# Patient Record
Sex: Female | Born: 1985 | Race: Black or African American | Hispanic: No | Marital: Single | Smoking: Never smoker
Health system: Southern US, Community
[De-identification: ages and names within clinical notes are randomized; demographics above are authoritative.]

## PROBLEM LIST (undated history)

## (undated) DIAGNOSIS — K219 Gastro-esophageal reflux disease without esophagitis: Secondary | ICD-10-CM

## (undated) DIAGNOSIS — K0889 Other specified disorders of teeth and supporting structures: Secondary | ICD-10-CM

## (undated) DIAGNOSIS — D649 Anemia, unspecified: Secondary | ICD-10-CM

## (undated) HISTORY — DX: Gastro-esophageal reflux disease without esophagitis: K21.9

## (undated) HISTORY — DX: Other specified disorders of teeth and supporting structures: K08.89

---

## 2011-12-27 ENCOUNTER — Emergency Department (INDEPENDENT_AMBULATORY_CARE_PROVIDER_SITE_OTHER)
Admission: EM | Admit: 2011-12-27 | Discharge: 2011-12-27 | Disposition: A | Discharge status: Home / Self Care | Payer: BC Managed Care – PPO | Source: Home / Self Care | Attending: Emergency Medicine | Admitting: Emergency Medicine

## 2011-12-27 ENCOUNTER — Encounter (HOSPITAL_COMMUNITY): Payer: Self-pay | Admitting: *Deleted

## 2011-12-27 DIAGNOSIS — K047 Periapical abscess without sinus: Secondary | ICD-10-CM

## 2011-12-27 MED ORDER — HYDROCODONE-ACETAMINOPHEN 5-325 MG PO TABS
ORAL_TABLET | ORAL | Status: AC
  Filled 2011-12-27: qty 2

## 2011-12-27 MED ORDER — PENICILLIN V POTASSIUM 500 MG PO TABS: 500.0000 mg | ORAL_TABLET | Freq: Four times a day (QID) | ORAL | Status: AC

## 2011-12-27 MED ORDER — HYDROCODONE-ACETAMINOPHEN 5-325 MG PO TABS
2.0000 | ORAL_TABLET | Freq: Once | ORAL | Status: AC
  Administered 2011-12-27: 2 via ORAL

## 2011-12-27 MED ORDER — HYDROCODONE-ACETAMINOPHEN 5-325 MG PO TABS: 2.0000 | ORAL_TABLET | ORAL | Status: AC | PRN

## 2011-12-27 NOTE — ED Provider Notes (Signed)
Medical screening examination/treatment/procedure(s) were performed by non-physician practitioner and as supervising physician I was immediately available for consultation/collaboration.  Leslee Home, M.D.   Reuben Likes, MD 12/27/11 [phone]

## 2011-12-27 NOTE — ED Provider Notes (Signed)
History     CSN: [id number]  Arrival date & time 12/27/11  1810   First MD Initiated Contact with Patient 12/27/11 1909      Chief Complaint  Patient presents with  . Dental Pain    (Consider location/radiation/quality/duration/timing/severity/associated sxs/prior treatment) HPI Comments: Pt with "2 rotten teeth" that began hurting 2 days ago, facial swelling as of this morning.   Patient is a 26 y.o. female presenting with tooth pain. The history is provided by the patient.  Dental PainThe primary symptoms include mouth pain. Primary symptoms do not include fever. The symptoms began 2 days ago. The symptoms are worsening. The symptoms are new. The symptoms occur constantly.  Additional symptoms include: dental sensitivity to temperature, gum swelling and facial swelling.    History reviewed. No pertinent past medical history.  Past Surgical History  Procedure Date  . Cesarean section     History reviewed. No pertinent family history.  History  Substance Use Topics  . Smoking status: Not on file  . Smokeless tobacco: Not on file  . Alcohol Use: No    OB History    Grav Para Term Preterm Abortions TAB SAB Ect Mult Living                  Review of Systems  Constitutional: Positive for chills. Negative for fever.  HENT: Positive for facial swelling and dental problem.   Skin: Negative for color change and wound.    Allergies  Review of patient's allergies indicates no known allergies.  Home Medications   Current Outpatient Rx  Name Route Sig Dispense Refill  . HYDROCODONE-ACETAMINOPHEN 5-325 MG PO TABS Oral Take 2 tablets by mouth every 4 (four) hours as needed for pain. 16 tablet 0  . PENICILLIN V POTASSIUM 500 MG PO TABS Oral Take 1 tablet (500 mg total) by mouth 4 (four) times daily. 40 tablet 0    BP 143/112  Pulse 104  Temp 98.7 F (37.1 C) (Oral)  Resp 21  SpO2 100%  LMP 11/27/2011  Physical Exam  Constitutional: She appears well-developed and  well-nourished.       Uncomfortable appearing  HENT:  Head: There is trismus in the jaw.  Mouth/Throat: Abnormal dentition. Dental abscesses and dental caries present.         Teeth in poor repair  Lymphadenopathy:       Head (right side): Submandibular and tonsillar adenopathy present. No submental adenopathy present.       Head (left side): No submental, no submandibular and no tonsillar adenopathy present.  Skin: Skin is warm, dry and intact. No rash noted. No erythema.    ED Course  Procedures (including critical care time)  Labs Reviewed - No data to display No results found.   1. Dental abscess       MDM          Cathlyn Parsons, NP 12/27/11 1926

## 2011-12-27 NOTE — ED Notes (Signed)
Co pain at right upper and lower back molars.  States she has cavities in these teeth.  Has taken ibuprofen for pain.

## 2012-03-20 ENCOUNTER — Emergency Department (INDEPENDENT_AMBULATORY_CARE_PROVIDER_SITE_OTHER)
Admission: EM | Admit: 2012-03-20 | Discharge: 2012-03-20 | Disposition: A | Discharge status: Home / Self Care | Payer: BC Managed Care – PPO | Source: Home / Self Care | Attending: Family Medicine | Admitting: Family Medicine

## 2012-03-20 ENCOUNTER — Encounter (HOSPITAL_COMMUNITY): Payer: Self-pay

## 2012-03-20 DIAGNOSIS — J020 Streptococcal pharyngitis: Secondary | ICD-10-CM

## 2012-03-20 LAB — POCT RAPID STREP A: Streptococcus, Group A Screen (Direct): POSITIVE — AB

## 2012-03-20 MED ORDER — AMOXICILLIN 500 MG PO CAPS: 500.0000 mg | ORAL_CAPSULE | Freq: Three times a day (TID) | ORAL | Status: DC

## 2012-03-20 NOTE — ED Provider Notes (Signed)
History     CSN: [id number]  Arrival date & time 03/20/12  1415   First MD Initiated Contact with Patient 03/20/12 1421      Chief Complaint  Patient presents with  . Sore Throat    sore throat, bodyaches, headache,dizzy    (Consider location/radiation/quality/duration/timing/severity/associated sxs/prior treatment) Patient is a 26 y.o. female presenting with pharyngitis. The history is provided by the patient.  Sore Throat This is a new problem. The current episode started more than 2 days ago. The problem has been gradually worsening. The symptoms are aggravated by swallowing.    History reviewed. No pertinent past medical history.  Past Surgical History  Procedure Date  . Cesarean section     History reviewed. No pertinent family history.  History  Substance Use Topics  . Smoking status: Not on file  . Smokeless tobacco: Not on file  . Alcohol Use: No    OB History    Grav Para Term Preterm Abortions TAB SAB Ect Mult Living                  Review of Systems  Constitutional: Positive for fever, chills and appetite change.  HENT: Positive for sore throat. Negative for rhinorrhea and postnasal drip.   Gastrointestinal: Negative.   Skin: Negative for rash.    Allergies  Review of patient's allergies indicates no known allergies.  Home Medications   Current Outpatient Rx  Name  Route  Sig  Dispense  Refill  . NYQUIL PO   Oral   Take 1 tablet by mouth.         . AMOXICILLIN 500 MG PO CAPS   Oral   Take 1 capsule (500 mg total) by mouth 3 (three) times daily.   30 capsule   0     BP 117/65  Pulse 95  Temp 99.1 F (37.3 C) (Oral)  Resp 20  SpO2 100%  LMP 03/08/2012  Physical Exam  Nursing note and vitals reviewed. Constitutional: She is oriented to person, place, and time. She appears well-developed and well-nourished.  HENT:  Head: Normocephalic.  Right Ear: External ear normal.  Left Ear: External ear normal.  Mouth/Throat: Uvula is  midline and mucous membranes are normal. Oropharyngeal exudate and posterior oropharyngeal erythema present.  Eyes: Conjunctivae normal are normal. Pupils are equal, round, and reactive to light.  Neck: Normal range of motion. Neck supple.  Cardiovascular: Normal rate, normal heart sounds and intact distal pulses.   Pulmonary/Chest: Effort normal and breath sounds normal.  Lymphadenopathy:    She has cervical adenopathy.  Neurological: She is alert and oriented to person, place, and time.  Skin: Skin is warm and dry.    ED Course  Procedures (including critical care time)  Labs Reviewed  POCT RAPID STREP A ([facility] ONLY) - Abnormal; Notable for the following:    Streptococcus, Group A Screen (Direct) POSITIVE (*)     All other components within normal limits   No results found.   1. Strep sore throat       MDM  Strep pos        Linna Hoff, MD 03/20/12 1535

## 2012-03-20 NOTE — ED Notes (Signed)
Pt states she started with sore throat 4-5 days ago, now has fever, body aches, dizziness, headache. States she has had strep throat before and it feels the same now.

## 2019-01-19 LAB — OB RESULTS CONSOLE RPR: RPR: NONREACTIVE

## 2019-01-19 LAB — OB RESULTS CONSOLE ANTIBODY SCREEN: Antibody Screen: NEGATIVE

## 2019-01-19 LAB — OB RESULTS CONSOLE GC/CHLAMYDIA
Chlamydia: NEGATIVE
Gonorrhea: NEGATIVE

## 2019-01-19 LAB — OB RESULTS CONSOLE RUBELLA ANTIBODY, IGM: Rubella: IMMUNE

## 2019-01-19 LAB — OB RESULTS CONSOLE PLATELET COUNT: Platelets: 320

## 2019-01-19 LAB — OB RESULTS CONSOLE GBS: GBS: POSITIVE

## 2019-01-19 LAB — OB RESULTS CONSOLE ABO/RH: RH Type: POSITIVE

## 2019-01-19 LAB — OB RESULTS CONSOLE HIV ANTIBODY (ROUTINE TESTING): HIV: NONREACTIVE

## 2019-01-19 LAB — OB RESULTS CONSOLE HGB/HCT, BLOOD
HCT: 10 — AB (ref 29–41)
Hemoglobin: 31.6

## 2019-01-19 LAB — OB RESULTS CONSOLE HEPATITIS B SURFACE ANTIGEN: Hepatitis B Surface Ag: NEGATIVE

## 2019-03-14 ENCOUNTER — Other Ambulatory Visit: Payer: Self-pay

## 2019-03-14 ENCOUNTER — Encounter (HOSPITAL_COMMUNITY): Payer: Self-pay

## 2019-03-14 ENCOUNTER — Inpatient Hospital Stay (HOSPITAL_COMMUNITY)
Admission: AD | Admit: 2019-03-14 | Discharge: 2019-03-14 | Disposition: A | Discharge status: Home / Self Care | Payer: Medicaid Other | Attending: Obstetrics & Gynecology | Admitting: Obstetrics & Gynecology

## 2019-03-14 DIAGNOSIS — O26892 Other specified pregnancy related conditions, second trimester: Secondary | ICD-10-CM | POA: Diagnosis present

## 2019-03-14 DIAGNOSIS — O98312 Other infections with a predominantly sexual mode of transmission complicating pregnancy, second trimester: Secondary | ICD-10-CM | POA: Diagnosis not present

## 2019-03-14 DIAGNOSIS — Z3A23 23 weeks gestation of pregnancy: Secondary | ICD-10-CM

## 2019-03-14 DIAGNOSIS — A5901 Trichomonal vulvovaginitis: Secondary | ICD-10-CM | POA: Diagnosis not present

## 2019-03-14 HISTORY — DX: Anemia, unspecified: D64.9

## 2019-03-14 LAB — OB RESULTS CONSOLE GC/CHLAMYDIA
Chlamydia: NEGATIVE
Gonorrhea: NEGATIVE

## 2019-03-14 LAB — URINE CULTURE
Glucose 1 Hour: 127
Pap: NEGATIVE
Urine Culture, OB: NO GROWTH

## 2019-03-14 LAB — OB RESULTS CONSOLE VARICELLA ZOSTER ANTIBODY, IGG: Varicella: IMMUNE

## 2019-03-14 MED ORDER — DIPHENHYDRAMINE HCL 50 MG/ML IJ SOLN
25.0000 mg | Freq: Once | INTRAMUSCULAR | Status: AC
Start: 2019-03-14 — End: 2019-03-14
  Administered 2019-03-14: 25 mg via INTRAVENOUS
  Filled 2019-03-14: qty 1

## 2019-03-14 MED ORDER — METRONIDAZOLE 500 MG PO TABS
2000.0000 mg | ORAL_TABLET | Freq: Once | ORAL | Status: AC
  Administered 2019-03-14: 21:00:00 2000 mg via ORAL
  Filled 2019-03-14: qty 4

## 2019-03-14 MED ORDER — FAMOTIDINE IN NACL 20-0.9 MG/50ML-% IV SOLN
20.0000 mg | Freq: Once | INTRAVENOUS | Status: AC
  Administered 2019-03-14: 21:00:00 20 mg via INTRAVENOUS
  Filled 2019-03-14: qty 50

## 2019-03-14 NOTE — MAU Provider Note (Signed)
First Provider Initiated Contact with Patient 03/14/19 1929     S Ms. Tziporah Knoke is a 33 y.o. G6P0005 @~23w (exact dates unknown) female who presents to MAU today from the [facility] for trichomonas treatment. 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 Given no other treatment options exist outside of the metronidazole class of medications, pt requires anti-histamine administration prior to administration of metronidazole along with hospital observation. Pt reports no concerns at this time.  O BP 115/65 (BP Location: Right Arm)   Pulse 95   Temp 97.9 F (36.6 C)   Resp 18   Ht 5\' 3"  (1.6 m)   Wt 80.2 kg   LMP 10/04/2018   SpO2 100% Comment: room air  BMI 31.30 kg/m  Physical Exam  Constitutional: She is oriented to person, place, and time. She appears well-developed and well-nourished. No distress.  HENT:  Head: Normocephalic and atraumatic.  Respiratory: Effort normal.  Neurological: She is alert and oriented to person, place, and time.  Skin: She is not diaphoretic.  Psychiatric: She has a normal mood and affect. Her behavior is normal. Judgment and thought content normal.   A Trichomonas infection Pregnant female with true allergy to metronidazole (whole body rash and vaginal swelling) Medical screening exam complete  FHR: 145 (per Dr. Harolyn Rutherford, do not need to perform fetal monitoring)  P Consulted with Dr. Harolyn Rutherford to establish plan of care. RN made aware of plan of care verbally, per below. Establish IV access, no fluids 25mg  Benadryl IV, followed by 20mg  Pepcid IV 53min after administration of above medication, 2g metronidazole to be given PO After PO administration of metronidazole, pt to be observed in MAU for 2hrs. If OK after 2hrs without allergic reaction, to be discharged to home. If allergic reaction occurs after PO administration of metronidazole, can give 25mg  IV Benadryl and call Dr.  Harolyn Rutherford Pt aware can take Benadryl at home if experiencing allergic reaction, and then is to return to MAU immediately. Pt reports she has Benadryl at home and does not need an RX. Discharge from MAU in stable condition Warning signs for worsening condition that would warrant emergency follow-up discussed Patient may return to MAU as needed for pregnancy related complaints  Tae Vonada, Gerrie Nordmann, NP 03/14/2019 8:41 PM

## 2019-03-14 NOTE — Progress Notes (Signed)
Pt resting comfortably and reporting no side effects from medication.

## 2019-03-14 NOTE — MAU Note (Signed)
[facility]. She is allergic to Flagyl so needs pre treatment for the allergy

## 2019-03-14 NOTE — Discharge Instructions (Signed)
Drug Allergy A drug allergy happens when the body's disease-fighting system (immune system) reacts badly to a medicine. Drug allergies range from mild to severe. Some allergic reactions occur one week or more after you are exposed to a medicine (delayed reaction). A sudden (acute), severe allergic reaction that affects multiple areas of the body is called an anaphylactic reaction (anaphylaxis). Anaphylaxis can be life-threatening. All allergic reactions to a medicine require medical evaluation, even if the allergic reaction appears to be mild. What are the causes? This condition is caused by the immune system wrongly identifying a medicine as being harmful. When this happens, the body releases proteins (antibodies) and other compounds, such as histamine, into the bloodstream. This causes swelling in certain tissues and reduces blood flow to important areas, such as the heart and lungs. Almost any medicine can cause an allergic reaction. Medicines that commonly cause allergic reactions (are common allergens) include:  Penicillin.  Sulfa medicines (sulfonamides).  Medicines that numb certain areas of the body (local anesthetics).  X-ray dyes that contain iodine. What are the signs or symptoms? Common symptoms of a mild allergic reaction include:  Nasal congestion.  Tingling in the mouth.  An itchy, red rash. Common symptoms of a severe allergic reaction include:  Swelling of the eyes, lips, face, or tongue.  Swelling of the back of the mouth and the throat.  Wheezing.  A hoarse voice.  Itchy, red, swollen areas of skin (hives).  Dizziness or light-headedness.  Fainting.  Anxiety or confusion.  Abdominal pain.  Difficulty breathing, speaking, or swallowing.  Chest tightness.  Fast or irregular heartbeats (palpitations).  Vomiting.  Diarrhea. How is this diagnosed? This condition is diagnosed based on a physical exam and your history of recent exposure to one or more  medicines. You may be referred for follow-up testing by a health care provider who specializes in allergies. This testing can confirm the diagnosis of a drug allergy and determine which medicines you are allergic to. Testing may include:  Skin tests. These may involve: ? Injecting a small amount of the possible allergen between layers of your skin (intradermal injection). ? Applying patches to your skin.  Blood tests.  Drug challenge. For this test, a health care provider gives you a small amount of a medicine in gradual doses while watching for an allergic reaction. If you are unsure of what caused your allergic reaction, your health care provider may ask you for:  Information about all medicines that you take on a regular basis.  The date and time of your reaction. How is this treated? There is no cure for allergies. However, an allergic reaction can be treated with:  Medicines that help: ? Reduce pain and swelling (NSAIDs). ? Relieve itching and hives (antihistamines). ? Reduce swelling (corticosteroids).  Respiratory inhalers. These are inhaled medicines that help open (dilate) the airways in your lungs.  Injections of medicine that helps to relax the muscles in your airways and tighten your blood vessels (epinephrine). Severe allergic reactions, such as anaphylaxis, require immediate treatment in a hospital. You may need to be hospitalized for observation. You may also be prescribed rescue medicines, such as epinephrine. Epinephrine comes in many forms, including what is commonly called an auto-injector "pen" (pre-filled automatic epinephrine injection device). Follow these instructions at home: If you have a severe allergy   Always keep an auto-injector pen or your anaphylaxis kit near you. These can be lifesaving if you have a severe reaction. Use your auto-injector pen or anaphylaxis kit   as told by your health care provider.  Make sure that you, the members of your household,  and your employer know: ? How to use an anaphylaxis kit. ? How to use an auto-injector pen to give you an epinephrine injection.  Replace your epinephrine immediately after you use your auto-injector pen, in case you have another reaction.  Wear a medical alert bracelet or necklace that states your drug allergy, if told by your health care provider. General instructions  Avoidmedicines that you are allergic to.  Take over-the-counter and prescription medicines only as told by your health care provider.  If you were given medicines to treat your reaction, do not drive until your health care provider approves.  If you have hives or a rash: ? Use an over-the-counter antihistamineas told by your health care provider. ? Apply cold, wet cloths (cold compresses) to your skin or take baths or showers in cool water. Avoid hot water.  If you had tests done, it is up to you to get your test results. Ask your health care provider when your results will be ready.  Tell any health care providers who care for you that you have a drug allergy.  Keep all follow-up visits as told by your health care provider. This is important. Contact a health care provider if:  You think that you are having a mild allergic reaction. Symptoms of an allergic reaction usually start within 30 minutes after you are exposed to a medicine.  You have symptoms that last more than 2 days after your reaction.  Your symptoms get worse.  You develop new symptoms. Get help right away if:  You needed to use epinephrine. ? An epinephrine injection helps to manage life-threatening allergic reactions, but you still need to go to the emergency room even if epinephrine seems to work. This is important because anaphylaxis may happen again within 72 hours (rebound anaphylaxis). ? If you used epinephrine to treat anaphylaxis outside of the hospital, you need additional medical care. This may include more doses of epinephrine.  You  develop symptoms of a severe allergic reaction. These symptoms may represent a serious problem that is an emergency. Do not wait to see if the symptoms will go away. Use your auto-injector pen or anaphylaxis kit as you have been instructed, and get medical help right away. Call your local emergency services (911 in the [place] Do not drive yourself to the hospital. Summary  A drug allergy happens when the body's disease-fighting system reacts badly to a medicine.  Drug allergies range from mild to severe. In some cases, an allergic reaction may be life-threatening.  If you have a severe allergy, always keep an auto-injector pen or your anaphylaxis kit near you. This information is not intended to replace advice given to you by your health care provider. Make sure you discuss any questions you have with your health care provider. Document Released: 04/28/2005 Document Revised: 11/11/2017 Document Reviewed: 11/11/2017 Elsevier Patient Education  [place].     Trichomoniasis Trichomoniasis is an STI (sexually transmitted infection) that can affect both women and men. In women, the outer area of the female genitalia (vulva) and the vagina are affected. In men, mainly the penis is affected, but the prostate and other reproductive organs can also be involved.  This condition can be treated with medicine. It often has no symptoms (is asymptomatic), especially in men. If not treated, trichomoniasis can last for months or years. What are the causes? This condition is caused  by a parasite called Trichomonas vaginalis. Trichomoniasis most often spreads from person to person (is contagious) through sexual contact. What increases the risk? The following factors may make you more likely to develop this condition:  Having unprotected sex.  Having sex with a partner who has trichomoniasis.  Having multiple sexual partners.  Having had previous trichomoniasis infections or other STIs. What are  the signs or symptoms? In women, symptoms of trichomoniasis include:  Abnormal vaginal discharge that is clear, white, gray, or yellow-green and foamy and has an unusual "fishy" odor.  Itching and irritation of the vagina and vulva.  Burning or pain during urination or sex.  Redness and swelling of the genitals. In men, symptoms of trichomoniasis include:  Penile discharge that may be foamy or contain pus.  Pain in the penis. This may happen only when urinating.  Itching or irritation inside the penis.  Burning after urination or ejaculation. How is this diagnosed? In women, this condition may be found during a routine Pap test or physical exam. It may be found in men during a routine physical exam. Your health care provider may do tests to help diagnose this infection, such as:  Urine tests (men and women).  The following in women: ? Testing the pH of the vagina. ? A vaginal swab test that checks for the Trichomonas vaginalis parasite. ? Testing vaginal secretions. Your health care provider may test you for other STIs, including HIV (human immunodeficiency virus). How is this treated? This condition is treated with medicine taken by mouth (orally), such as metronidazole or tinidazole, to fight the infection. Your sexual partner(s) also need to be tested and treated.  If you are a woman and you plan to become pregnant or think you may be pregnant, tell your health care provider right away. Some medicines that are used to treat the infection should not be taken during pregnancy. Your health care provider may recommend over-the-counter medicines or creams to help relieve itching or irritation. You may be tested for infection again 3 months after treatment. Follow these instructions at home:  Take and use over-the-counter and prescription medicines, including creams, only as told by your health care provider.  Take your antibiotic medicine as told by your health care provider. Do  not stop taking the antibiotic even if you start to feel better.  Do not have sex until 7-10 days after you finish your medicine, or until your health care provider approves. Ask your health care provider when you may start to have sex again.  (Women) Do not douche or wear tampons while you have the infection.  Discuss your infection with your sexual partner(s). Make sure that your partner gets tested and treated, if necessary.  Keep all follow-up visits as told by your health care provider. This is important. How is this prevented?   Use condoms every time you have sex. Using condoms correctly and consistently can help protect against STIs.  Avoid having multiple sexual partners.  Talk with your sexual partner about any symptoms that either of you may have, as well as any history of STIs.  Get tested for STIs and STDs (sexually transmitted diseases) before you have sex. Ask your partner to do the same.  Do not have sexual contact if you have symptoms of trichomoniasis or another STI. Contact a health care provider if:  You still have symptoms after you finish your medicine.  You develop pain in your abdomen.  You have pain when you urinate.  You have  bleeding after sex.  You develop a rash.  You feel nauseous or you vomit.  You plan to become pregnant or think you may be pregnant. Summary  Trichomoniasis is an STI (sexually transmitted infection) that can affect both women and men.  This condition often has no symptoms (is asymptomatic), especially in men.  Without treatment, this condition can last for months or years.  You should not have sex until 7-10 days after you finish your medicine, or until your health care provider approves. Ask your health care provider when you may start to have sex again.  Discuss your infection with your sexual partner(s). Make sure that your partner gets tested and treated, if necessary. This information is not intended to replace  advice given to you by your health care provider. Make sure you discuss any questions you have with your health care provider. Document Released: 10/22/2000 Document Revised: 02/09/2018 Document Reviewed: 02/09/2018 Elsevier Patient Education  2020 [employer].

## 2019-03-28 ENCOUNTER — Encounter: Payer: Self-pay | Admitting: *Deleted

## 2019-03-28 ENCOUNTER — Telehealth: Payer: Self-pay | Admitting: Obstetrics & Gynecology

## 2019-03-28 DIAGNOSIS — Z98891 History of uterine scar from previous surgery: Secondary | ICD-10-CM | POA: Insufficient documentation

## 2019-03-28 DIAGNOSIS — D649 Anemia, unspecified: Secondary | ICD-10-CM | POA: Insufficient documentation

## 2019-03-28 DIAGNOSIS — D508 Other iron deficiency anemias: Secondary | ICD-10-CM

## 2019-03-28 DIAGNOSIS — O0990 Supervision of high risk pregnancy, unspecified, unspecified trimester: Secondary | ICD-10-CM

## 2019-03-28 DIAGNOSIS — K0889 Other specified disorders of teeth and supporting structures: Secondary | ICD-10-CM

## 2019-03-28 HISTORY — DX: Supervision of high risk pregnancy, unspecified, unspecified trimester: O09.90

## 2019-03-28 NOTE — Telephone Encounter (Signed)
Attempted to contact patient about her appointment on 11/17 @ 9:55. No answer and unable to leave a message due to the ringing busy. Work number rang several times before I hung up. No voicemail started

## 2019-03-29 ENCOUNTER — Encounter: Payer: Self-pay | Admitting: Obstetrics & Gynecology

## 2019-03-29 ENCOUNTER — Encounter: Payer: Medicaid Other | Admitting: Obstetrics & Gynecology

## 2019-04-05 ENCOUNTER — Other Ambulatory Visit: Payer: Self-pay

## 2019-04-05 ENCOUNTER — Ambulatory Visit (INDEPENDENT_AMBULATORY_CARE_PROVIDER_SITE_OTHER): Payer: Medicaid Other | Admitting: Family Medicine

## 2019-04-05 VITALS — BP 117/73 | HR 96 | Wt 176.0 lb

## 2019-04-05 DIAGNOSIS — O0992 Supervision of high risk pregnancy, unspecified, second trimester: Secondary | ICD-10-CM

## 2019-04-05 DIAGNOSIS — Z98891 History of uterine scar from previous surgery: Secondary | ICD-10-CM

## 2019-04-05 DIAGNOSIS — A599 Trichomoniasis, unspecified: Secondary | ICD-10-CM

## 2019-04-05 DIAGNOSIS — O98812 Other maternal infectious and parasitic diseases complicating pregnancy, second trimester: Secondary | ICD-10-CM

## 2019-04-05 DIAGNOSIS — Z3A26 26 weeks gestation of pregnancy: Secondary | ICD-10-CM

## 2019-04-05 DIAGNOSIS — R8271 Bacteriuria: Secondary | ICD-10-CM | POA: Insufficient documentation

## 2019-04-05 DIAGNOSIS — D508 Other iron deficiency anemias: Secondary | ICD-10-CM

## 2019-04-05 DIAGNOSIS — O0990 Supervision of high risk pregnancy, unspecified, unspecified trimester: Secondary | ICD-10-CM

## 2019-04-05 MED ORDER — FERROUS SULFATE 324 (65 FE) MG PO TBEC: 1.0000 | DELAYED_RELEASE_TABLET | ORAL | 5 refills | Status: DC

## 2019-04-05 MED ORDER — BLOOD PRESSURE KIT DEVI: 1.0000 | Freq: Every day | 0 refills | Status: DC

## 2019-04-05 NOTE — Patient Instructions (Addendum)
Contraception Choices Contraception, also called birth control, refers to methods or devices that prevent pregnancy. Hormonal methods Contraceptive implant  A contraceptive implant is a thin, plastic tube that contains a hormone. It is inserted into the upper part of the arm. It can remain in place for up to 3 years. Progestin-only injections Progestin-only injections are injections of progestin, a synthetic form of the hormone progesterone. They are given every 3 months by a health care provider. Birth control pills  Birth control pills are pills that contain hormones that prevent pregnancy. They must be taken once a day, preferably at the same time each day. Birth control patch  The birth control patch contains hormones that prevent pregnancy. It is placed on the skin and must be changed once a week for three weeks and removed on the fourth week. A prescription is needed to use this method of contraception. Vaginal ring  A vaginal ring contains hormones that prevent pregnancy. It is placed in the vagina for three weeks and removed on the fourth week. After that, the process is repeated with a new ring. A prescription is needed to use this method of contraception. Emergency contraceptive Emergency contraceptives prevent pregnancy after unprotected sex. They come in pill form and can be taken up to 5 days after sex. They work best the sooner they are taken after having sex. Most emergency contraceptives are available without a prescription. This method should not be used as your only form of birth control. Barrier methods Female condom  A female condom is a thin sheath that is worn over the penis during sex. Condoms keep sperm from going inside a woman's body. They can be used with a spermicide to increase their effectiveness. They should be disposed after a single use. Female condom  A female condom is a soft, loose-fitting sheath that is put into the vagina before sex. The condom keeps  sperm from going inside a woman's body. They should be disposed after a single use. Diaphragm  A diaphragm is a soft, dome-shaped barrier. It is inserted into the vagina before sex, along with a spermicide. The diaphragm blocks sperm from entering the uterus, and the spermicide kills sperm. A diaphragm should be left in the vagina for 6-8 hours after sex and removed within 24 hours. A diaphragm is prescribed and fitted by a health care provider. A diaphragm should be replaced every 1-2 years, after giving birth, after gaining more than 15 lb (6.8 kg), and after pelvic surgery. Cervical cap  A cervical cap is a round, soft latex or plastic cup that fits over the cervix. It is inserted into the vagina before sex, along with spermicide. It blocks sperm from entering the uterus. The cap should be left in place for 6-8 hours after sex and removed within 48 hours. A cervical cap must be prescribed and fitted by a health care provider. It should be replaced every 2 years. Sponge  A sponge is a soft, circular piece of polyurethane foam with spermicide on it. The sponge helps block sperm from entering the uterus, and the spermicide kills sperm. To use it, you make it wet and then insert it into the vagina. It should be inserted before sex, left in for at least 6 hours after sex, and removed and thrown away within 30 hours. Spermicides Spermicides are chemicals that kill or block sperm from entering the cervix and uterus. They can come as a cream, jelly, suppository, foam, or tablet. A spermicide should be inserted into  the vagina with an applicator at least 10-15 minutes before sex to allow time for it to work. The process must be repeated every time you have sex. Spermicides do not require a prescription. Intrauterine contraception Intrauterine device (IUD) An IUD is a T-shaped device that is put in a woman's uterus. There are two types:  Hormone IUD.This type contains progestin, a synthetic form of the  hormone progesterone. This type can stay in place for 3-5 years.  Copper IUD.This type is wrapped in copper wire. It can stay in place for 10 years.  Permanent methods of contraception Female tubal ligation In this method, a woman's fallopian tubes are sealed, tied, or blocked during surgery to prevent eggs from traveling to the uterus. Hysteroscopic sterilization In this method, a small, flexible insert is placed into each fallopian tube. The inserts cause scar tissue to form in the fallopian tubes and block them, so sperm cannot reach an egg. The procedure takes about 3 months to be effective. Another form of birth control must be used during those 3 months. Female sterilization This is a procedure to tie off the tubes that carry sperm (vasectomy). After the procedure, the man can still ejaculate fluid (semen). Natural planning methods Natural family planning In this method, a couple does not have sex on days when the woman could become pregnant. Calendar method This means keeping track of the length of each menstrual cycle, identifying the days when pregnancy can happen, and not having sex on those days. Ovulation method In this method, a couple avoids sex during ovulation. Symptothermal method This method involves not having sex during ovulation. The woman typically checks for ovulation by watching changes in her temperature and in the consistency of cervical mucus. Post-ovulation method In this method, a couple waits to have sex until after ovulation. Summary  Contraception, also called birth control, means methods or devices that prevent pregnancy.  Hormonal methods of contraception include implants, injections, pills, patches, vaginal rings, and emergency contraceptives.  Barrier methods of contraception can include female condoms, female condoms, diaphragms, cervical caps, sponges, and spermicides.  There are two types of IUDs (intrauterine devices). An IUD can be put in a woman's  uterus to prevent pregnancy for 3-5 years.  Permanent sterilization can be done through a procedure for males, females, or both.  Natural family planning methods involve not having sex on days when the woman could become pregnant. This information is not intended to replace advice given to you by your health care provider. Make sure you discuss any questions you have with your health care provider. Document Released: 04/28/2005 Document Revised: 04/30/2017 Document Reviewed: 05/31/2016 Elsevier Patient Education  2020 [employer]   Breastfeeding  Choosing to breastfeed is one of the best decisions you can make for yourself and your baby. A change in hormones during pregnancy causes your breasts to make breast milk in your milk-producing glands. Hormones prevent breast milk from being released before your baby is born. They also prompt milk flow after birth. Once breastfeeding has begun, thoughts of your baby, as well as his or her sucking or crying, can stimulate the release of milk from your milk-producing glands. Benefits of breastfeeding Research shows that breastfeeding offers many health benefits for infants and mothers. It also offers a cost-free and convenient way to feed your baby. For your baby  Your first milk (colostrum) helps your baby's digestive system to function better.  Special cells in your milk (antibodies) help your baby to fight off infections.    Breastfed babies are less likely to develop asthma, allergies, obesity, or type 2 diabetes. They are also at lower risk for sudden infant death syndrome (SIDS).  Nutrients in breast milk are better able to meet your baby's needs compared to infant formula.  Breast milk improves your baby's brain development. For you  Breastfeeding helps to create a very special bond between you and your baby.  Breastfeeding is convenient. Breast milk costs nothing and is always available at the correct temperature.  Breastfeeding helps  to burn calories. It helps you to lose the weight that you gained during pregnancy.  Breastfeeding makes your uterus return faster to its size before pregnancy. It also slows bleeding (lochia) after you give birth.  Breastfeeding helps to lower your risk of developing type 2 diabetes, osteoporosis, rheumatoid arthritis, cardiovascular disease, and breast, ovarian, uterine, and endometrial cancer later in life. Breastfeeding basics Starting breastfeeding  Find a comfortable place to sit or lie down, with your neck and back well-supported.  Place a pillow or a rolled-up blanket under your baby to bring him or her to the level of your breast (if you are seated). Nursing pillows are specially designed to help support your arms and your baby while you breastfeed.  Make sure that your baby's tummy (abdomen) is facing your abdomen.  Gently massage your breast. With your fingertips, massage from the outer edges of your breast inward toward the nipple. This encourages milk flow. If your milk flows slowly, you may need to continue this action during the feeding.  Support your breast with 4 fingers underneath and your thumb above your nipple (make the letter "C" with your hand). Make sure your fingers are well away from your nipple and your baby's mouth.  Stroke your baby's lips gently with your finger or nipple.  When your baby's mouth is open wide enough, quickly bring your baby to your breast, placing your entire nipple and as much of the areola as possible into your baby's mouth. The areola is the colored area around your nipple. ? More areola should be visible above your baby's upper lip than below the lower lip. ? Your baby's lips should be opened and extended outward (flanged) to ensure an adequate, comfortable latch. ? Your baby's tongue should be between his or her lower gum and your breast.  Make sure that your baby's mouth is correctly positioned around your nipple (latched). Your baby's  lips should create a seal on your breast and be turned out (everted).  It is common for your baby to suck about 2-3 minutes in order to start the flow of breast milk. Latching Teaching your baby how to latch onto your breast properly is very important. An improper latch can cause nipple pain, decreased milk supply, and poor weight gain in your baby. Also, if your baby is not latched onto your nipple properly, he or she may swallow some air during feeding. This can make your baby fussy. Burping your baby when you switch breasts during the feeding can help to get rid of the air. However, teaching your baby to latch on properly is still the best way to prevent fussiness from swallowing air while breastfeeding. Signs that your baby has successfully latched onto your nipple  Silent tugging or silent sucking, without causing you pain. Infant's lips should be extended outward (flanged).  Swallowing heard between every 3-4 sucks once your milk has started to flow (after your let-down milk reflex occurs).  Muscle movement above and in front of  his or her ears while sucking. Signs that your baby has not successfully latched onto your nipple  Sucking sounds or smacking sounds from your baby while breastfeeding.  Nipple pain. If you think your baby has not latched on correctly, slip your finger into the corner of your baby's mouth to break the suction and place it between your baby's gums. Attempt to start breastfeeding again. Signs of successful breastfeeding Signs from your baby  Your baby will gradually decrease the number of sucks or will completely stop sucking.  Your baby will fall asleep.  Your baby's body will relax.  Your baby will retain a small amount of milk in his or her mouth.  Your baby will let go of your breast by himself or herself. Signs from you  Breasts that have increased in firmness, weight, and size 1-3 hours after feeding.  Breasts that are softer immediately after  breastfeeding.  Increased milk volume, as well as a change in milk consistency and color by the fifth day of breastfeeding.  Nipples that are not sore, cracked, or bleeding. Signs that your baby is getting enough milk  Wetting at least 1-2 diapers during the first 24 hours after birth.  Wetting at least 5-6 diapers every 24 hours for the first week after birth. The urine should be clear or pale yellow by the age of 5 days.  Wetting 6-8 diapers every 24 hours as your baby continues to grow and develop.  At least 3 stools in a 24-hour period by the age of 5 days. The stool should be soft and yellow.  At least 3 stools in a 24-hour period by the age of 7 days. The stool should be seedy and yellow.  No loss of weight greater than 10% of birth weight during the first 3 days of life.  Average weight gain of 4-7 oz (113-198 g) per week after the age of 4 days.  Consistent daily weight gain by the age of 5 days, without weight loss after the age of 2 weeks. After a feeding, your baby may spit up a small amount of milk. This is normal. Breastfeeding frequency and duration Frequent feeding will help you make more milk and can prevent sore nipples and extremely full breasts (breast engorgement). Breastfeed when you feel the need to reduce the fullness of your breasts or when your baby shows signs of hunger. This is called "breastfeeding on demand." Signs that your baby is hungry include:  Increased alertness, activity, or restlessness.  Movement of the head from side to side.  Opening of the mouth when the corner of the mouth or cheek is stroked (rooting).  Increased sucking sounds, smacking lips, cooing, sighing, or squeaking.  Hand-to-mouth movements and sucking on fingers or hands.  Fussing or crying. Avoid introducing a pacifier to your baby in the first 4-6 weeks after your baby is born. After this time, you may choose to use a pacifier. Research has shown that pacifier use during the  first year of a baby's life decreases the risk of sudden infant death syndrome (SIDS). Allow your baby to feed on each breast as long as he or she wants. When your baby unlatches or falls asleep while feeding from the first breast, offer the second breast. Because newborns are often sleepy in the first few weeks of life, you may need to awaken your baby to get him or her to feed. Breastfeeding times will vary from baby to baby. However, the following rules can serve as  a guide to help you make sure that your baby is properly fed:  Newborns (babies 39 weeks of age or younger) may breastfeed every 1-3 hours.  Newborns should not go without breastfeeding for longer than 3 hours during the day or 5 hours during the night.  You should breastfeed your baby a minimum of 8 times in a 24-hour period. Breast milk pumping     Pumping and storing breast milk allows you to make sure that your baby is exclusively fed your breast milk, even at times when you are unable to breastfeed. This is especially important if you go back to work while you are still breastfeeding, or if you are not able to be present during feedings. Your lactation consultant can help you find a method of pumping that works best for you and give you guidelines about how long it is safe to store breast milk. Caring for your breasts while you breastfeed Nipples can become dry, cracked, and sore while breastfeeding. The following recommendations can help keep your breasts moisturized and healthy:  Avoid using soap on your nipples.  Wear a supportive bra designed especially for nursing. Avoid wearing underwire-style bras or extremely tight bras (sports bras).  Air-dry your nipples for 3-4 minutes after each feeding.  Use only cotton bra pads to absorb leaked breast milk. Leaking of breast milk between feedings is normal.  Use lanolin on your nipples after breastfeeding. Lanolin helps to maintain your skin's normal moisture barrier. Pure  lanolin is not harmful (not toxic) to your baby. You may also hand express a few drops of breast milk and gently massage that milk into your nipples and allow the milk to air-dry. In the first few weeks after giving birth, some women experience breast engorgement. Engorgement can make your breasts feel heavy, warm, and tender to the touch. Engorgement peaks within 3-5 days after you give birth. The following recommendations can help to ease engorgement:  Completely empty your breasts while breastfeeding or pumping. You may want to start by applying warm, moist heat (in the shower or with warm, water-soaked hand towels) just before feeding or pumping. This increases circulation and helps the milk flow. If your baby does not completely empty your breasts while breastfeeding, pump any extra milk after he or she is finished.  Apply ice packs to your breasts immediately after breastfeeding or pumping, unless this is too uncomfortable for you. To do this: ? Put ice in a plastic bag. ? Place a towel between your skin and the bag. ? Leave the ice on for 20 minutes, 2-3 times a day.  Make sure that your baby is latched on and positioned properly while breastfeeding. If engorgement persists after 48 hours of following these recommendations, contact your health care provider or a Advertising copywriter. Overall health care recommendations while breastfeeding  Eat 3 healthy meals and 3 snacks every day. Well-nourished mothers who are breastfeeding need an additional 450-500 calories a day. You can meet this requirement by increasing the amount of a balanced diet that you eat.  Drink enough water to keep your urine pale yellow or clear.  Rest often, relax, and continue to take your prenatal vitamins to prevent fatigue, stress, and low vitamin and mineral levels in your body (nutrient deficiencies).  Do not use any products that contain nicotine or tobacco, such as cigarettes and e-cigarettes. Your baby may be  harmed by chemicals from cigarettes that pass into breast milk and exposure to secondhand smoke. If you need help  quitting, ask your health care provider.  Avoid alcohol.  Do not use illegal drugs or marijuana.  Talk with your health care provider before taking any medicines. These include over-the-counter and prescription medicines as well as vitamins and herbal supplements. Some medicines that may be harmful to your baby can pass through breast milk.  It is possible to become pregnant while breastfeeding. If birth control is desired, ask your health care provider about options that will be safe while breastfeeding your baby. Where to find more information: [employer]: www.llli.org Contact a health care provider if:  You feel like you want to stop breastfeeding or have become frustrated with breastfeeding.  Your nipples are cracked or bleeding.  Your breasts are red, tender, or warm.  You have: ? Painful breasts or nipples. ? A swollen area on either breast. ? A fever or chills. ? Nausea or vomiting. ? Drainage other than breast milk from your nipples.  Your breasts do not become full before feedings by the fifth day after you give birth.  You feel sad and depressed.  Your baby is: ? Too sleepy to eat well. ? Having trouble sleeping. ? More than 1 week old and wetting fewer than 6 diapers in a 24-hour period. ? Not gaining weight by 5 days of age.  Your baby has fewer than 3 stools in a 24-hour period.  Your baby's skin or the white parts of his or her eyes become yellow. Get help right away if:  Your baby is overly tired (lethargic) and does not want to wake up and feed.  Your baby develops an unexplained fever. Summary  Breastfeeding offers many health benefits for infant and mothers.  Try to breastfeed your infant when he or she shows early signs of hunger.  Gently tickle or stroke your baby's lips with your finger or nipple to allow the baby  to open his or her mouth. Bring the baby to your breast. Make sure that much of the areola is in your baby's mouth. Offer one side and burp the baby before you offer the other side.  Talk with your health care provider or lactation consultant if you have questions or you face problems as you breastfeed. This information is not intended to replace advice given to you by your health care provider. Make sure you discuss any questions you have with your health care provider. Document Released: 04/28/2005 Document Revised: 07/23/2017 Document Reviewed: 05/30/2016 Elsevier Patient Education  2020 Elsevier Inc.  

## 2019-04-05 NOTE — Progress Notes (Signed)
   Subjective:  Wendy Merritt is a 33 y.o. G6P0005 at [redacted]w[redacted]d being seen today for ongoing prenatal care.  She is currently monitored for the following issues for this high-risk pregnancy and has Supervision of high risk pregnancy, antepartum; History of C-section; Toothache; Anemia; and GBS bacteriuria on their problem list.  Patient reports no complaints.  Contractions: Not present.  .  Movement: Present. Denies leaking of fluid.   The following portions of the patient's history were reviewed and updated as appropriate: allergies, current medications, past family history, past medical history, past social history, past surgical history and problem list. Problem list updated.  Objective:   Vitals:   04/05/19 1024  BP: 117/73  Pulse: 96  Weight: 176 lb (79.8 kg)    Fetal Status: Fetal Heart Rate (bpm): 142 Fundal Height: 27 cm Movement: Present     General:  Alert, oriented and cooperative. Patient is in no acute distress.  Skin: Skin is warm and dry. No rash noted.   Cardiovascular: Normal heart rate noted  Respiratory: Normal respiratory effort, no problems with respiration noted  Abdomen: Soft, gravid, appropriate for gestational age. Pain/Pressure: Present     Pelvic:       Cervical exam deferred        Extremities: Normal range of motion.  Edema: None  Mental Status: Normal mood and affect. Normal behavior. Normal judgment and thought content.   Urinalysis:      Assessment and Plan:  Pregnancy: [id number] at [redacted]w[redacted]d   1. History of C-section Reviewed history of cesarean x5 Interested in [facility] however discussed this is extremely high risk and not recommended, she is amenable with repeat cesarean  2. Supervision of high risk pregnancy, antepartum Prenatal records reviewed, problem list updated Discussed contraception at length, contemplating BTL vs bilateral salpingectomy vs vasectomy for partner, follow up at next visit  3. Other iron deficiency anemia Iron supplement rx   4. GBS bacteriuria Noted in prenatal records Problem list updated  5. Trichomonas Seen in [facility] 03/14/2019 for desensitization therapy and treatment of trichomonas (has allergy to metronidazole) Not yet due for TOC, to be done at next visit  Preterm labor symptoms and general obstetric precautions including but not limited to vaginal bleeding, contractions, leaking of fluid and fetal movement were reviewed in detail with the patient. Please refer to After Visit Summary for other counseling recommendations.  Return in 4 weeks (on 05/03/2019) for [facility], in person.   Clarnce Flock, MD

## 2019-04-05 NOTE — Progress Notes (Signed)
1st baby had low heart beat she was around 12yrs old then. 2nd baby it was  her option 3-5th had no options was

## 2019-05-04 ENCOUNTER — Other Ambulatory Visit: Payer: Self-pay

## 2019-05-04 ENCOUNTER — Telehealth (INDEPENDENT_AMBULATORY_CARE_PROVIDER_SITE_OTHER): Payer: Medicaid Other | Admitting: Obstetrics and Gynecology

## 2019-05-04 DIAGNOSIS — O0993 Supervision of high risk pregnancy, unspecified, third trimester: Secondary | ICD-10-CM | POA: Diagnosis not present

## 2019-05-04 DIAGNOSIS — A599 Trichomoniasis, unspecified: Secondary | ICD-10-CM | POA: Diagnosis not present

## 2019-05-04 DIAGNOSIS — R8271 Bacteriuria: Secondary | ICD-10-CM

## 2019-05-04 DIAGNOSIS — O99013 Anemia complicating pregnancy, third trimester: Secondary | ICD-10-CM

## 2019-05-04 DIAGNOSIS — O23593 Infection of other part of genital tract in pregnancy, third trimester: Secondary | ICD-10-CM

## 2019-05-04 DIAGNOSIS — D508 Other iron deficiency anemias: Secondary | ICD-10-CM

## 2019-05-04 DIAGNOSIS — Z3A30 30 weeks gestation of pregnancy: Secondary | ICD-10-CM

## 2019-05-04 DIAGNOSIS — O0990 Supervision of high risk pregnancy, unspecified, unspecified trimester: Secondary | ICD-10-CM

## 2019-05-04 DIAGNOSIS — Z98891 History of uterine scar from previous surgery: Secondary | ICD-10-CM

## 2019-05-04 NOTE — Progress Notes (Signed)
I connected with  Wendy Merritt on 05/04/19 at 10:55 AM EST by telephone and verified that I am speaking with the correct person using two identifiers.   I discussed the limitations, risks, security and privacy concerns of performing an evaluation and management service by telephone and the availability of in person appointments. I also discussed with the patient that there may be a patient responsible charge related to this service. The patient expressed understanding and agreed to proceed.  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

## 2019-05-04 NOTE — Progress Notes (Signed)
    TELEHEALTH VIRTUAL OBSTETRICS VISIT ENCOUNTER NOTE  Clinic: [facility]  I connected with Cira Servant on 05/04/19 at 10:55 AM EST by telephone at home and verified that I am speaking with the correct person using two identifiers.   I discussed the limitations, risks, security and privacy concerns of performing an evaluation and management service by telephone and the availability of in person appointments. I also discussed with the patient that there may be a patient responsible charge related to this service. The patient expressed understanding and agreed to proceed.  Subjective:  Wendy Merritt is a 33 y.o. G6P0005 at [redacted]w[redacted]d being followed for ongoing prenatal care.  She is currently monitored for the following issues for this high-risk pregnancy and has Supervision of high risk pregnancy, antepartum; History of C-section; Toothache; Anemia; GBS bacteriuria; and Trichomonal infection on their problem list.  Patient reports no complaints. Reports fetal movement. Denies any contractions, bleeding or leaking of fluid.   The following portions of the patient's history were reviewed and updated as appropriate: allergies, current medications, past family history, past medical history, past social history, past surgical history and problem list.   Objective:  There were no vitals filed for this visit.  Babyscripts Data Reviewed: not applicable  General:  Alert, oriented and cooperative.   Mental Status: Normal mood and affect perceived. Normal judgment and thought content.  Rest of physical exam deferred due to type of encounter  Assessment and Plan:  Pregnancy: G6P0005 at [redacted]w[redacted]d 1. GBS bacteriuria toc neg  2. Trichomonal infection toc nv  3. Supervision of high risk pregnancy, antepartum I see her one hour but not others labs. Can do other bloodwork with nv. Needs to pick up cuff from [place]; Future - RPR; Future - HIV antibody (with  reflex); Future  4. Other iron deficiency anemia F/u cbc nv  5. History of C-section Last one 04/2014 in [place] at [facility] will ask front desk to get. F/u nv  Preterm labor symptoms and general obstetric precautions including but not limited to vaginal bleeding, contractions, leaking of fluid and fetal movement were reviewed in detail with the patient.  I discussed the assessment and treatment plan with the patient. The patient was provided an opportunity to ask questions and all were answered. The patient agreed with the plan and demonstrated an understanding of the instructions. The patient was advised to call back or seek an in-person office evaluation/go to [facility] at [facility] for any urgent or concerning symptoms. Please refer to After Visit Summary for other counseling recommendations.   I provided 10 minutes of non-face-to-face time during this encounter. The visit was conducted via MyChart-medicine  Return in about 2 weeks (around 05/18/2019) for in person, high risk.  Future Appointments  Date Time Provider [place]  05/19/2019  2:55 PM Chancy Milroy, MD [place], [place] [facility] [place]

## 2019-05-16 ENCOUNTER — Telehealth: Payer: Self-pay | Admitting: Family Medicine

## 2019-05-16 NOTE — Telephone Encounter (Signed)
Spoke to patient in regards to her request to only see female providers. Patient instructed that for unfortunately a female provider is not available this week and wanted to know what she wanted to do. Patient offered to be rescheduled to the next available female provider or she could see the provider that she is scheduled with now and going forward she can be scheduled with a female provider. Patient agreed to see current provider that she is scheduled with as long as she can do her own self swab when she comes in. Patient instructed that I have updated her chart so if says she only wants to see females. Patient verbalized understanding.

## 2019-05-19 ENCOUNTER — Other Ambulatory Visit (HOSPITAL_COMMUNITY)
Admission: RE | Admit: 2019-05-19 | Discharge: 2019-05-19 | Disposition: A | Discharge status: Home / Self Care | Payer: Medicaid Other | Source: Ambulatory Visit | Attending: Obstetrics and Gynecology | Admitting: Obstetrics and Gynecology

## 2019-05-19 ENCOUNTER — Encounter: Payer: Self-pay | Admitting: Obstetrics and Gynecology

## 2019-05-19 ENCOUNTER — Encounter: Payer: Medicaid Other | Admitting: Obstetrics and Gynecology

## 2019-05-19 ENCOUNTER — Ambulatory Visit (INDEPENDENT_AMBULATORY_CARE_PROVIDER_SITE_OTHER): Payer: Medicaid Other | Admitting: Obstetrics and Gynecology

## 2019-05-19 ENCOUNTER — Other Ambulatory Visit: Payer: Self-pay

## 2019-05-19 VITALS — BP 107/73 | HR 118 | Wt 184.4 lb

## 2019-05-19 DIAGNOSIS — D509 Iron deficiency anemia, unspecified: Secondary | ICD-10-CM

## 2019-05-19 DIAGNOSIS — Z3A32 32 weeks gestation of pregnancy: Secondary | ICD-10-CM

## 2019-05-19 DIAGNOSIS — R8271 Bacteriuria: Secondary | ICD-10-CM

## 2019-05-19 DIAGNOSIS — A599 Trichomoniasis, unspecified: Secondary | ICD-10-CM | POA: Insufficient documentation

## 2019-05-19 DIAGNOSIS — O0990 Supervision of high risk pregnancy, unspecified, unspecified trimester: Secondary | ICD-10-CM

## 2019-05-19 DIAGNOSIS — O0993 Supervision of high risk pregnancy, unspecified, third trimester: Secondary | ICD-10-CM

## 2019-05-19 DIAGNOSIS — Z98891 History of uterine scar from previous surgery: Secondary | ICD-10-CM

## 2019-05-19 NOTE — Progress Notes (Signed)
Subjective:  Wendy Merritt is a 34 y.o. G6P0005 at [redacted]w[redacted]d being seen today for ongoing prenatal care.  She is currently monitored for the following issues for this high-risk pregnancy and has Supervision of high risk pregnancy, antepartum; History of C-section; Anemia; GBS bacteriuria; and Trichomonal infection on their problem list.  Patient reports heartburn.  Contractions: Irritability. Vag. Bleeding: None.  Movement: Present. Denies leaking of fluid.   The following portions of the patient's history were reviewed and updated as appropriate: allergies, current medications, past family history, past medical history, past social history, past surgical history and problem list. Problem list updated.  Objective:   Vitals:   05/19/19 1528  BP: 107/73  Pulse: (!) 118  Weight: 184 lb 6.4 oz (83.6 kg)    Fetal Status: Fetal Heart Rate (bpm): 132   Movement: Present     General:  Alert, oriented and cooperative. Patient is in no acute distress.  Skin: Skin is warm and dry. No rash noted.   Cardiovascular: Normal heart rate noted  Respiratory: Normal respiratory effort, no problems with respiration noted  Abdomen: Soft, gravid, appropriate for gestational age. Pain/Pressure: Present     Pelvic:  Cervical exam deferred        Extremities: Normal range of motion.  Edema: None  Mental Status: Normal mood and affect. Normal behavior. Normal judgment and thought content.   Urinalysis:      Assessment and Plan:  Pregnancy: G6P0005 at [redacted]w[redacted]d  1. Trichimoniasis TOC today - Cervicovaginal ancillary only( [place])  2. Iron deficiency anemia, unspecified iron deficiency anemia type Continue with iron supplement - CBC  3. Supervision of high risk pregnancy, antepartum Stable  4. History of C-section For repeat at 39 weeks  5. GBS bacteriuria TOC today  6. Trichomonal infection See above  Preterm labor symptoms and general obstetric precautions including but not limited to  vaginal bleeding, contractions, leaking of fluid and fetal movement were reviewed in detail with the patient. Please refer to After Visit Summary for other counseling recommendations.  Return in about 2 weeks (around 06/02/2019) for OB visit, virtual.   Hermina Staggers, MD

## 2019-05-19 NOTE — Patient Instructions (Signed)
Third Trimester of Pregnancy The third trimester is from week 28 through week 40 (months 7 through 9). The third trimester is a time when the unborn baby (fetus) is growing rapidly. At the end of the ninth month, the fetus is about 20 inches in length and weighs 6-10 pounds. Body changes during your third trimester Your body will continue to go through many changes during pregnancy. The changes vary from woman to woman. During the third trimester:  Your weight will continue to increase. You can expect to gain 25-35 pounds (11-16 kg) by the end of the pregnancy.  You may begin to get stretch marks on your hips, abdomen, and breasts.  You may urinate more often because the fetus is moving lower into your pelvis and pressing on your bladder.  You may develop or continue to have heartburn. This is caused by increased hormones that slow down muscles in the digestive tract.  You may develop or continue to have constipation because increased hormones slow digestion and cause the muscles that push waste through your intestines to relax.  You may develop hemorrhoids. These are swollen veins (varicose veins) in the rectum that can itch or be painful.  You may develop swollen, bulging veins (varicose veins) in your legs.  You may have increased body aches in the pelvis, back, or thighs. This is due to weight gain and increased hormones that are relaxing your joints.  You may have changes in your hair. These can include thickening of your hair, rapid growth, and changes in texture. Some women also have hair loss during or after pregnancy, or hair that feels dry or thin. Your hair will most likely return to normal after your baby is born.  Your breasts will continue to grow and they will continue to become tender. A yellow fluid (colostrum) may leak from your breasts. This is the first milk you are producing for your baby.  Your belly button may stick out.  You may notice more swelling in your hands,  face, or ankles.  You may have increased tingling or numbness in your hands, arms, and legs. The skin on your belly may also feel numb.  You may feel short of breath because of your expanding uterus.  You may have more problems sleeping. This can be caused by the size of your belly, increased need to urinate, and an increase in your body's metabolism.  You may notice the fetus "dropping," or moving lower in your abdomen (lightening).  You may have increased vaginal discharge.  You may notice your joints feel loose and you may have pain around your pelvic bone. What to expect at prenatal visits You will have prenatal exams every 2 weeks until week 36. Then you will have weekly prenatal exams. During a routine prenatal visit:  You will be weighed to make sure you and the baby are growing normally.  Your blood pressure will be taken.  Your abdomen will be measured to track your baby's growth.  The fetal heartbeat will be listened to.  Any test results from the previous visit will be discussed.  You may have a cervical check near your due date to see if your cervix has softened or thinned (effaced).  You will be tested for Group B streptococcus. This happens between 35 and 37 weeks. Your health care provider may ask you:  What your birth plan is.  How you are feeling.  If you are feeling the baby move.  If you have had any abnormal   symptoms, such as leaking fluid, bleeding, severe headaches, or abdominal cramping.  If you are using any tobacco products, including cigarettes, chewing tobacco, and electronic cigarettes.  If you have any questions. Other tests or screenings that may be performed during your third trimester include:  Blood tests that check for low iron levels (anemia).  Fetal testing to check the health, activity level, and growth of the fetus. Testing is done if you have certain medical conditions or if there are problems during the pregnancy.  Nonstress test  (NST). This test checks the health of your baby to make sure there are no signs of problems, such as the baby not getting enough oxygen. During this test, a belt is placed around your belly. The baby is made to move, and its heart rate is monitored during movement. What is false labor? False labor is a condition in which you feel small, irregular tightenings of the muscles in the womb (contractions) that usually go away with rest, changing position, or drinking water. These are called Braxton Hicks contractions. Contractions may last for hours, days, or even weeks before true labor sets in. If contractions come at regular intervals, become more frequent, increase in intensity, or become painful, you should see your health care provider. What are the signs of labor?  Abdominal cramps.  Regular contractions that start at 10 minutes apart and become stronger and more frequent with time.  Contractions that start on the top of the uterus and spread down to the lower abdomen and back.  Increased pelvic pressure and dull back pain.  A watery or bloody mucus discharge that comes from the vagina.  Leaking of amniotic fluid. This is also known as your "water breaking." It could be a slow trickle or a gush. Let your health care provider know if it has a color or strange odor. If you have any of these signs, call your health care provider right away, even if it is before your due date. Follow these instructions at home: Medicines  Follow your health care provider's instructions regarding medicine use. Specific medicines may be either safe or unsafe to take during pregnancy.  Take a prenatal vitamin that contains at least 600 micrograms (mcg) of folic acid.  If you develop constipation, try taking a stool softener if your health care provider approves. Eating and drinking   Eat a balanced diet that includes fresh fruits and vegetables, whole grains, good sources of protein such as meat, eggs, or tofu,  and low-fat dairy. Your health care provider will help you determine the amount of weight gain that is right for you.  Avoid raw meat and uncooked cheese. These carry germs that can cause birth defects in the baby.  If you have low calcium intake from food, talk to your health care provider about whether you should take a daily calcium supplement.  Eat four or five small meals rather than three large meals a day.  Limit foods that are high in fat and processed sugars, such as fried and sweet foods.  To prevent constipation: ? Drink enough fluid to keep your urine clear or pale yellow. ? Eat foods that are high in fiber, such as fresh fruits and vegetables, whole grains, and beans. Activity  Exercise only as directed by your health care provider. Most women can continue their usual exercise routine during pregnancy. Try to exercise for 30 minutes at least 5 days a week. Stop exercising if you experience uterine contractions.  Avoid heavy lifting.  Do   not exercise in extreme heat or humidity, or at high altitudes.  Wear low-heel, comfortable shoes.  Practice good posture.  You may continue to have sex unless your health care provider tells you otherwise. Relieving pain and discomfort  Take frequent breaks and rest with your legs elevated if you have leg cramps or low back pain.  Take warm sitz baths to soothe any pain or discomfort caused by hemorrhoids. Use hemorrhoid cream if your health care provider approves.  Wear a good support bra to prevent discomfort from breast tenderness.  If you develop varicose veins: ? Wear support pantyhose or compression stockings as told by your healthcare provider. ? Elevate your feet for 15 minutes, 3-4 times a day. Prenatal care  Write down your questions. Take them to your prenatal visits.  Keep all your prenatal visits as told by your health care provider. This is important. Safety  Wear your seat belt at all times when driving.  Make  a list of emergency phone numbers, including numbers for family, friends, the hospital, and police and fire departments. General instructions  Avoid cat litter boxes and soil used by cats. These carry germs that can cause birth defects in the baby. If you have a cat, ask someone to clean the litter box for you.  Do not travel far distances unless it is absolutely necessary and only with the approval of your health care provider.  Do not use hot tubs, steam rooms, or saunas.  Do not drink alcohol.  Do not use any products that contain nicotine or tobacco, such as cigarettes and e-cigarettes. If you need help quitting, ask your health care provider.  Do not use any medicinal herbs or unprescribed drugs. These chemicals affect the formation and growth of the baby.  Do not douche or use tampons or scented sanitary pads.  Do not cross your legs for long periods of time.  To prepare for the arrival of your baby: ? Take prenatal classes to understand, practice, and ask questions about labor and delivery. ? Make a trial run to the hospital. ? Visit the hospital and tour the maternity area. ? Arrange for maternity or paternity leave through employers. ? Arrange for family and friends to take care of pets while you are in the hospital. ? Purchase a rear-facing car seat and make sure you know how to install it in your car. ? Pack your hospital bag. ? Prepare the baby's nursery. Make sure to remove all pillows and stuffed animals from the baby's crib to prevent suffocation.  Visit your dentist if you have not gone during your pregnancy. Use a soft toothbrush to brush your teeth and be gentle when you floss. Contact a health care provider if:  You are unsure if you are in labor or if your water has broken.  You become dizzy.  You have mild pelvic cramps, pelvic pressure, or nagging pain in your abdominal area.  You have lower back pain.  You have persistent nausea, vomiting, or  diarrhea.  You have an unusual or bad smelling vaginal discharge.  You have pain when you urinate. Get help right away if:  Your water breaks before 37 weeks.  You have regular contractions less than 5 minutes apart before 37 weeks.  You have a fever.  You are leaking fluid from your vagina.  You have spotting or bleeding from your vagina.  You have severe abdominal pain or cramping.  You have rapid weight loss or weight gain.  You have   shortness of breath with chest pain.  You notice sudden or extreme swelling of your face, hands, ankles, feet, or legs.  Your baby makes fewer than 10 movements in 2 hours.  You have severe headaches that do not go away when you take medicine.  You have vision changes. Summary  The third trimester is from week 28 through week 40, months 7 through 9. The third trimester is a time when the unborn baby (fetus) is growing rapidly.  During the third trimester, your discomfort may increase as you and your baby continue to gain weight. You may have abdominal, leg, and back pain, sleeping problems, and an increased need to urinate.  During the third trimester your breasts will keep growing and they will continue to become tender. A yellow fluid (colostrum) may leak from your breasts. This is the first milk you are producing for your baby.  False labor is a condition in which you feel small, irregular tightenings of the muscles in the womb (contractions) that eventually go away. These are called Braxton Hicks contractions. Contractions may last for hours, days, or even weeks before true labor sets in.  Signs of labor can include: abdominal cramps; regular contractions that start at 10 minutes apart and become stronger and more frequent with time; watery or bloody mucus discharge that comes from the vagina; increased pelvic pressure and dull back pain; and leaking of amniotic fluid. This information is not intended to replace advice given to you by your  health care provider. Make sure you discuss any questions you have with your health care provider. Document Revised: 08/19/2018 Document Reviewed: 06/03/2016 Elsevier Patient Education  2020 [employer]  

## 2019-05-20 LAB — CERVICOVAGINAL ANCILLARY ONLY
Comment: NEGATIVE
Trichomonas: NEGATIVE

## 2019-05-20 LAB — CBC
Hematocrit: 32.2 % — ABNORMAL LOW (ref 34.0–46.6)
Hemoglobin: 10 g/dL — ABNORMAL LOW (ref 11.1–15.9)
MCH: 22.7 pg — ABNORMAL LOW (ref 26.6–33.0)
MCHC: 31.1 g/dL — ABNORMAL LOW (ref 31.5–35.7)
MCV: 73 fL — ABNORMAL LOW (ref 79–97)
Platelets: 222 10*3/uL (ref 150–450)
RBC: 4.41 x10E6/uL (ref 3.77–5.28)
RDW: 17.3 % — ABNORMAL HIGH (ref 11.7–15.4)
WBC: 9 10*3/uL (ref 3.4–10.8)

## 2019-05-31 ENCOUNTER — Telehealth: Payer: Self-pay | Admitting: Lactation Services

## 2019-05-31 ENCOUNTER — Inpatient Hospital Stay (HOSPITAL_COMMUNITY)
Admission: AD | Admit: 2019-05-31 | Discharge: 2019-05-31 | Disposition: A | Discharge status: Home / Self Care | Payer: Medicaid Other | Attending: Family Medicine | Admitting: Family Medicine

## 2019-05-31 ENCOUNTER — Encounter (HOSPITAL_COMMUNITY): Payer: Self-pay | Admitting: Family Medicine

## 2019-05-31 ENCOUNTER — Other Ambulatory Visit: Payer: Self-pay

## 2019-05-31 DIAGNOSIS — Z881 Allergy status to other antibiotic agents status: Secondary | ICD-10-CM | POA: Diagnosis not present

## 2019-05-31 DIAGNOSIS — D649 Anemia, unspecified: Secondary | ICD-10-CM | POA: Insufficient documentation

## 2019-05-31 DIAGNOSIS — O99013 Anemia complicating pregnancy, third trimester: Secondary | ICD-10-CM | POA: Diagnosis not present

## 2019-05-31 DIAGNOSIS — O34219 Maternal care for unspecified type scar from previous cesarean delivery: Secondary | ICD-10-CM | POA: Insufficient documentation

## 2019-05-31 DIAGNOSIS — Z3A34 34 weeks gestation of pregnancy: Secondary | ICD-10-CM

## 2019-05-31 DIAGNOSIS — O99891 Other specified diseases and conditions complicating pregnancy: Secondary | ICD-10-CM | POA: Diagnosis not present

## 2019-05-31 DIAGNOSIS — N898 Other specified noninflammatory disorders of vagina: Secondary | ICD-10-CM

## 2019-05-31 DIAGNOSIS — Z79899 Other long term (current) drug therapy: Secondary | ICD-10-CM | POA: Insufficient documentation

## 2019-05-31 DIAGNOSIS — O26893 Other specified pregnancy related conditions, third trimester: Secondary | ICD-10-CM

## 2019-05-31 LAB — WET PREP, GENITAL
Clue Cells Wet Prep HPF POC: NONE SEEN
Sperm: NONE SEEN
Trich, Wet Prep: NONE SEEN
Yeast Wet Prep HPF POC: NONE SEEN

## 2019-05-31 NOTE — MAU Note (Signed)
Pt reports that she started having brownish vaginal discharge yesterday.  Once she went to the bathroom she wiped and saw brownish red discharge.She reports having to change panty liner x 3 yesterday.  Pt denies any discharge today.   + FM. Pt also reports that she felt that she needed to come to ER to be evaluated when she felt the baby ball up and mild pain after the discharge.

## 2019-05-31 NOTE — MAU Provider Note (Signed)
Chief Complaint:  Vaginal Discharge   First Provider Initiated Contact with Patient 05/31/19 2140     HPI: Wendy Merritt is a 34 y.o. G6P5005 at 85w1dwho presents to maternity admissions reporting vaginal discharge. Symptoms occurred yesterday. Reports red/brown mucoid discharge. Denies abdominal pain, LOF, vaginal irritation, or recent intercourse. Good fetal movement.     Past Medical History:  Diagnosis Date  . Acid reflux   . Anemia   . Toothache    OB History  Gravida Para Term Preterm AB Living  6 5 5     5   SAB TAB Ectopic Multiple Live Births          5    # Outcome Date GA Lbr Len/2nd Weight Sex Delivery Anes PTL Lv  6 Current           5 Term 04/14/14 366w0d F CS-Unspec EPI N LIV  4 Term 05/11/09 393w0dM CS-Unspec EPI N LIV  3 Term 07/15/07 39w12w0d CS-Unspec EPI N LIV  2 Term 10/04/04 [id number]-Unspec EPI N LIV     Birth Comments: repeat C/S   1 Term 07/20/01 [id number] g M CS-Unspec Gen  LIV     B[facility] Comments[facility] History:  Procedure Laterality Date  . CESAREAN SECTION     History reviewed. No pertinent family history. Social History   Tobacco Use  . Smoking status: Never Smoker  . Smokeless tobacco: Never Used  Substance Use Topics  . Alcohol use: No  . Drug use: No   Allergies  Allergen Reactions  . Flagyl [Metronidazole] Rash   No medications prior to admission.    I have reviewed patient's Past Medical Hx, Surgical Hx, Family Hx, Social Hx, medications and allergies.   ROS:  Review of Systems  Constitutional: Negative.   Gastrointestinal: Negative.   Genitourinary: Positive for vaginal discharge.    Physical Exam   Patient Vitals for the past 24 hrs:  BP Temp Temp src Pulse Resp SpO2 Height Weight  05/31/19 2304 116/68 98.2 F (36.8 C) Oral 94 18 100 % -- --  05/31/19 2110 117/70 98.3 F (36.8 C) Oral (!) 107 18 100 % 5' 2"  (1.575 m) 84.7 kg    Constitutional: Well-developed, well-nourished  female in no acute distress.  Cardiovascular: normal rate & rhythm, no murmur Respiratory: normal effort, lung sounds clear throughout GI: Abd soft, non-tender, gravid appropriate for gestational age. Pos BS x 4 MS: Extremities nontender, no edema, normal ROM Neurologic: Alert and oriented x 4.  GU:      Pelvic: NEFG, small amount of brown clumpy discharge. No bleeding. No pooling.   Dilation: Closed(Simultaneous filing. User may not have seen previous data.) Effacement (%): 50 Cervical Position: Middle Exam by:: Hendrix Console LJorje Guildimultaneous filing. User may not have seen previous data.)  NST:  Baseline: 140 bpm, Variability: Good {> 6 bpm), Accelerations: Reactive and Decelerations: Absent   Labs: Results for orders placed or performed during the hospital encounter of 05/31/19 (from the past 24 hour(s))  Wet prep, genital     Status: Abnormal   Collection Time: 05/31/19 10:20 PM  Result Value Ref Range   Yeast Wet Prep HPF POC NONE SEEN NONE SEEN   Trich, Wet Prep NONE SEEN NONE SEEN   Clue Cells Wet Prep HPF POC NONE SEEN NONE SEEN   WBC, Wet Prep HPF POC MODERATE (A) NONE SEEN  Sperm NONE SEEN     Imaging:  No results found.  MAU Course: Orders Placed This Encounter  Procedures  . Wet prep, genital  . Discharge patient   No orders of the defined types were placed in this encounter.   MDM: Reactive NST. No contractions. Cervix closed No blood on exam RH positive  Wet prep & GC/CT pending  Assessment: 1. Vaginal discharge during pregnancy in third trimester   2. [redacted] weeks gestation of pregnancy     Plan: Discharge home in stable condition.  Preterm Labor precautions and fetal kick counts GC/CT pending   Allergies as of 05/31/2019      Reactions   Flagyl [metronidazole] Rash      Medication List    TAKE these medications   Blood Pressure Kit Devi 1 Device by Does not apply route daily. ICD 10: Z34.00   ferrous sulfate 324 (65 Fe) MG Tbec Take 1  tablet (325 mg total) by mouth every other day.   prenatal multivitamin Tabs tablet Take 1 tablet by mouth daily at 12 noon.       Jorje Guild, NP 05/31/2019 11:35 PM

## 2019-05-31 NOTE — Discharge Instructions (Signed)
Fetal Movement Counts Patient Name: ________________________________________________ Patient Due Date: ____________________ What is a fetal movement count?  A fetal movement count is the number of times that you feel your baby move during a certain amount of time. This may also be called a fetal kick count. A fetal movement count is recommended for every pregnant woman. You may be asked to start counting fetal movements as early as week 28 of your pregnancy. Pay attention to when your baby is most active. You may notice your baby's sleep and wake cycles. You may also notice things that make your baby move more. You should do a fetal movement count:  When your baby is normally most active.  At the same time each day. A good time to count movements is while you are resting, after having something to eat and drink. How do I count fetal movements? 1. Find a quiet, comfortable area. Sit, or lie down on your side. 2. Write down the date, the start time and stop time, and the number of movements that you felt between those two times. Take this information with you to your health care visits. 3. Write down your start time when you feel the first movement. 4. Count kicks, flutters, swishes, rolls, and jabs. You should feel at least 10 movements. 5. You may stop counting after you have felt 10 movements, or if you have been counting for 2 hours. Write down the stop time. 6. If you do not feel 10 movements in 2 hours, contact your health care provider for further instructions. Your health care provider may want to do additional tests to assess your baby's well-being. Contact a health care provider if:  You feel fewer than 10 movements in 2 hours.  Your baby is not moving like he or she usually does. Date: ____________ Start time: ____________ Stop time: ____________ Movements: ____________ Date: ____________ Start time: ____________ Stop time: ____________ Movements: ____________ Date: ____________  Start time: ____________ Stop time: ____________ Movements: ____________ Date: ____________ Start time: ____________ Stop time: ____________ Movements: ____________ Date: ____________ Start time: ____________ Stop time: ____________ Movements: ____________ Date: ____________ Start time: ____________ Stop time: ____________ Movements: ____________ Date: ____________ Start time: ____________ Stop time: ____________ Movements: ____________ Date: ____________ Start time: ____________ Stop time: ____________ Movements: ____________ Date: ____________ Start time: ____________ Stop time: ____________ Movements: ____________ This information is not intended to replace advice given to you by your health care provider. Make sure you discuss any questions you have with your health care provider. Document Revised: 12/16/2018 Document Reviewed: 12/16/2018 Elsevier Patient Education  2020 [employer]     Warning Signs During Pregnancy A pregnancy lasts about 40 weeks, starting from the first day of your last period until the baby is born. Pregnancy is divided into three phases called trimesters.  The first trimester refers to week 1 through week 13 of pregnancy.  The second trimester is the start of week 14 through the end of week 27.  The third trimester is the start of week 28 until you deliver your baby. During each trimester of pregnancy, certain signs and symptoms may indicate a problem. Talk with your health care provider about your current health and any medical conditions you have. Make sure you know the symptoms that you should watch for and report. How does this affect me?  Warning signs in the first trimester While some changes during the first trimester may be uncomfortable, most do not represent a serious problem. Let your health care provider know if   the following warning signs in the first trimester:  You cannot eat or drink without vomiting, and this lasts for longer than a  day.  You have vaginal bleeding or spotting along with menstrual-like cramping.  You have diarrhea for longer than a day.  You have a fever or other signs of infection, such as: ? Pain or burning when you urinate. ? Foul smelling or thick or yellowish vaginal discharge. Warning signs in the second trimester As your baby grows and changes during the second trimester, there are additional signs and symptoms that may indicate a problem. These include:  Signs and symptoms of infection, including a fever.  Signs or symptoms of a miscarriage or preterm labor, such as regular contractions, menstrual-like cramping, or lower abdominal pain.  Bloody or watery vaginal discharge or obvious vaginal bleeding.  Feeling like your heart is pounding.  Having trouble breathing.  Nausea, vomiting, or diarrhea that lasts for longer than a day.  Craving non-food items, such as clay, chalk, or dirt. This may be a sign of a very treatable medical condition called pica. Later in your second trimester, watch for signs and symptoms of a serious medical condition called preeclampsia.These include:  Changes in your vision.  A severe headache that does not go away.  Nausea and vomiting. It is also important to notice if your baby stops moving or moves less than usual during this time. Warning signs in the third trimester As you approach the third trimester, your baby is growing and your body is preparing for the birth of your baby. In your third trimester, be sure to let your health care provider know if:  You have signs and symptoms of infection, including a fever.  You have vaginal bleeding.  You notice that your baby is moving less than usual or is not moving.  You have nausea, vomiting, or diarrhea that lasts for longer than a day.  You have a severe headache that does not go away.  You have vision changes, including seeing spots or having blurry or double vision.  You have increased swelling  in your hands or face. How does this affect my baby? Throughout your pregnancy, always report any of the warning signs of a problem to your health care provider. This can help prevent complications that may affect your baby, including:  Increased risk for premature birth.  Infection that may be transmitted to your baby.  Increased risk for stillbirth. Contact a health care provider if:  You have any of the warning signs of a problem for the current trimester of your pregnancy.  Any of the following apply to you during any trimester of pregnancy: ? You have strong emotions, such as sadness or anxiety, that interfere with work or personal relationships. ? You feel unsafe in your home and need help finding a safe place to live. ? You are using tobacco products, alcohol, or drugs and you need help to stop. Get help right away if: You have signs or symptoms of labor before 37 weeks of pregnancy. These include:  Contractions that are 5 minutes or less apart, or that increase in frequency, intensity, or length.  Sudden, sharp abdominal pain or low back pain.  Uncontrolled gush or trickle of fluid from your vagina. Summary  A pregnancy lasts about 40 weeks, starting from the first day of your last period until the baby is born. Pregnancy is divided into three phases called trimesters. Each trimester has warning signs to watch for.  Always report  any warning signs to your health care provider in order to prevent complications that may affect both you and your baby.  Talk with your health care provider about your current health and any medical conditions you have. Make sure you know the symptoms that you should watch for and report. This information is not intended to replace advice given to you by your health care provider. Make sure you discuss any questions you have with your health care provider. Document Revised: 08/17/2018 Document Reviewed: 02/12/2017 Elsevier Patient Education  2020  [employer].

## 2019-05-31 NOTE — Telephone Encounter (Signed)
Called pt and her phone went to voicemail, LM for pt to call the office. Called home # and phone was busy. Sent pt a My Chart Message.

## 2019-06-01 LAB — GC/CHLAMYDIA PROBE AMP (~~LOC~~) NOT AT ARMC
Chlamydia: NEGATIVE
Comment: NEGATIVE
Comment: NORMAL
Neisseria Gonorrhea: NEGATIVE

## 2019-06-03 ENCOUNTER — Telehealth (INDEPENDENT_AMBULATORY_CARE_PROVIDER_SITE_OTHER): Payer: Medicaid Other | Admitting: Obstetrics and Gynecology

## 2019-06-03 ENCOUNTER — Encounter: Payer: Self-pay | Admitting: Obstetrics and Gynecology

## 2019-06-03 ENCOUNTER — Other Ambulatory Visit: Payer: Self-pay

## 2019-06-03 VITALS — BP 133/76 | HR 84

## 2019-06-03 DIAGNOSIS — Z98891 History of uterine scar from previous surgery: Secondary | ICD-10-CM

## 2019-06-03 DIAGNOSIS — R8271 Bacteriuria: Secondary | ICD-10-CM

## 2019-06-03 DIAGNOSIS — D508 Other iron deficiency anemias: Secondary | ICD-10-CM

## 2019-06-03 DIAGNOSIS — O0993 Supervision of high risk pregnancy, unspecified, third trimester: Secondary | ICD-10-CM

## 2019-06-03 DIAGNOSIS — Z3A34 34 weeks gestation of pregnancy: Secondary | ICD-10-CM

## 2019-06-03 DIAGNOSIS — O0990 Supervision of high risk pregnancy, unspecified, unspecified trimester: Secondary | ICD-10-CM

## 2019-06-03 NOTE — Progress Notes (Signed)
   TELEHEALTH OBSTETRICS PRENATAL VIRTUAL VIDEO VISIT ENCOUNTER NOTE  Provider location: Center for [facility] [place]   I connected with Wendy Merritt on 06/03/19 at 10:35 AM EST by MyChart Video Encounter at home and verified that I am speaking with the correct person using two identifiers.   I discussed the limitations, risks, security and privacy concerns of performing an evaluation and management service virtually and the availability of in person appointments. I also discussed with the patient that there may be a patient responsible charge related to this service. The patient expressed understanding and agreed to proceed. Subjective:  Wendy Merritt is a 34 y.o. G6P5005 at [redacted]w[redacted]d being seen today for ongoing prenatal care.  She is currently monitored for the following issues for this high-risk pregnancy and has Supervision of high risk pregnancy, antepartum; History of C-section; Anemia; GBS bacteriuria; and Trichomonal infection on their problem list.  Patient reports no complaints.  Contractions: Not present. Vag. Bleeding: None.  Movement: Present. Denies any leaking of fluid.   The following portions of the patient's history were reviewed and updated as appropriate: allergies, current medications, past family history, past medical history, past social history, past surgical history and problem list.   Objective:   Vitals:   06/03/19 1056  BP: 133/76  Pulse: 84   Fetal Status:     Movement: Present     General:  Alert, oriented and cooperative. Patient is in no acute distress.  Respiratory: Normal respiratory effort, no problems with respiration noted  Mental Status: Normal mood and affect. Normal behavior. Normal judgment and thought content.  Rest of physical exam deferred due to type of encounter  Imaging: No results found.  Assessment and Plan:  Pregnancy: G6P5005 at [redacted]w[redacted]d  1. Supervision of high risk pregnancy, antepartum Wants pills  2. History of  C-section - Message sent to schedule RCS today - Last CS note in media - will request [place] to evaluate placenta given h/o 5 prior c-sections  3. Other iron deficiency anemia  4. GBS bacteriuria  Preterm labor symptoms and general obstetric precautions including but not limited to vaginal bleeding, contractions, leaking of fluid and fetal movement were reviewed in detail with the patient. I discussed the assessment and treatment plan with the patient. The patient was provided an opportunity to ask questions and all were answered. The patient agreed with the plan and demonstrated an understanding of the instructions. The patient was advised to call back or seek an in-person office evaluation/go to [facility] at [facility] for any urgent or concerning symptoms. Please refer to After Visit Summary for other counseling recommendations.   I provided 15 minutes of face-to-face time during this encounter.  Return in about 2 weeks (around 06/17/2019) for high OB, in person.  No future appointments.  Conan Bowens, MD Center for [facility], [facility]

## 2019-06-03 NOTE — Progress Notes (Signed)
I connected with  Wendy Merritt on 06/03/19 at 10:35 AM EST by mychart encounter and verified that I am speaking with the correct person using two identifiers.   I discussed the limitations, risks, security and privacy concerns of performing an evaluation and management service by telephone and the availability of in person appointments. I also discussed with the patient that there may be a patient responsible charge related to this service. The patient expressed understanding and agreed to proceed.  Marylynn Pearson, RN 06/03/2019  10:54 AM

## 2019-06-20 ENCOUNTER — Other Ambulatory Visit: Payer: Self-pay

## 2019-06-20 ENCOUNTER — Ambulatory Visit (HOSPITAL_COMMUNITY): Payer: Medicaid Other | Admitting: *Deleted

## 2019-06-20 ENCOUNTER — Other Ambulatory Visit (HOSPITAL_COMMUNITY)
Admission: RE | Admit: 2019-06-20 | Discharge: 2019-06-20 | Disposition: A | Discharge status: Home / Self Care | Payer: Medicaid Other | Source: Ambulatory Visit | Attending: Family Medicine | Admitting: Family Medicine

## 2019-06-20 ENCOUNTER — Ambulatory Visit (HOSPITAL_COMMUNITY)
Admission: RE | Admit: 2019-06-20 | Discharge: 2019-06-20 | Disposition: A | Discharge status: Home / Self Care | Payer: Medicaid Other | Source: Ambulatory Visit | Attending: Obstetrics and Gynecology | Admitting: Obstetrics and Gynecology

## 2019-06-20 ENCOUNTER — Encounter (HOSPITAL_COMMUNITY): Payer: Self-pay

## 2019-06-20 ENCOUNTER — Ambulatory Visit (INDEPENDENT_AMBULATORY_CARE_PROVIDER_SITE_OTHER): Payer: Medicaid Other | Admitting: Family Medicine

## 2019-06-20 VITALS — BP 110/75 | HR 93 | Wt 190.6 lb

## 2019-06-20 DIAGNOSIS — O34219 Maternal care for unspecified type scar from previous cesarean delivery: Secondary | ICD-10-CM

## 2019-06-20 DIAGNOSIS — Z3A37 37 weeks gestation of pregnancy: Secondary | ICD-10-CM

## 2019-06-20 DIAGNOSIS — O0990 Supervision of high risk pregnancy, unspecified, unspecified trimester: Secondary | ICD-10-CM | POA: Diagnosis present

## 2019-06-20 DIAGNOSIS — Z98891 History of uterine scar from previous surgery: Secondary | ICD-10-CM | POA: Diagnosis present

## 2019-06-20 DIAGNOSIS — O99213 Obesity complicating pregnancy, third trimester: Secondary | ICD-10-CM

## 2019-06-20 DIAGNOSIS — O0993 Supervision of high risk pregnancy, unspecified, third trimester: Secondary | ICD-10-CM

## 2019-06-20 DIAGNOSIS — R8271 Bacteriuria: Secondary | ICD-10-CM

## 2019-06-20 NOTE — Patient Instructions (Signed)

## 2019-06-20 NOTE — Progress Notes (Signed)
   PRENATAL VISIT NOTE  Subjective:  Wendy Merritt is a 34 y.o. G6P5005 at [redacted]w[redacted]d being seen today for ongoing prenatal care.  She is currently monitored for the following issues for this low-risk pregnancy and has Supervision of high risk pregnancy, antepartum; History of C-section; Anemia; GBS bacteriuria; and Trichomonal infection on their problem list.  Patient reports no complaints.  Contractions: Irritability. Vag. Bleeding: None.  Movement: Present. Denies leaking of fluid.   The following portions of the patient's history were reviewed and updated as appropriate: allergies, current medications, past family history, past medical history, past social history, past surgical history and problem list.   Objective:   Vitals:   06/20/19 0847  BP: 110/75  Pulse: 93  Weight: 190 lb 9.6 oz (86.5 kg)    Fetal Status: Fetal Heart Rate (bpm): 133 Fundal Height: 35 cm Movement: Present  Presentation: Vertex  General:  Alert, oriented and cooperative. Patient is in no acute distress.  Skin: Skin is warm and dry. No rash noted.   Cardiovascular: Normal heart rate noted  Respiratory: Normal respiratory effort, no problems with respiration noted  Abdomen: Soft, gravid, appropriate for gestational age.  Pain/Pressure: Present     Pelvic: Cervical exam deferred Dilation: Closed Effacement (%): 40 Station: -2  Extremities: Normal range of motion.  Edema: Trace  Mental Status: Normal mood and affect. Normal behavior. Normal judgment and thought content.   Assessment and Plan:  Pregnancy: G6P5005 at [redacted]w[redacted]d 1. GBS bacteriuria Will not repeat culture  2. History of C-section For RCS at 39 wks  3. Supervision of high risk pregnancy, antepartum Continue routine prenatal care. - GC/Chlamydia probe amp ([place])not at [facility]  Term labor symptoms and general obstetric precautions including but not limited to vaginal bleeding, contractions, leaking of fluid and fetal movement were reviewed in  detail with the patient. Please refer to After Visit Summary for other counseling recommendations.   Return in 1 week (on 06/27/2019) for virtual.  Future Appointments  Date Time Provider Department Center  06/20/2019 12:45 PM [facility] NURSE [facility]  06/20/2019 12:45 PM [facility] [place] 5 [facility]    Reva Bores, MD

## 2019-06-21 NOTE — Patient Instructions (Signed)
Shakoya Gilmore  06/21/2019   Your procedure is scheduled on:  07/04/2019  Arrive at 1230 at Entrance C on [address] at [facility]  and [address]. You are invited to use the FREE valet parking or use the Visitor's parking deck.  Pick up the phone at the desk and dial [phone].  Call this number if you have problems the morning of surgery: [phone]  Remember:   Do not eat food:(After Midnight) Desps de medianoche.  Do not drink clear liquids: (After Midnight) Desps de medianoche.  Take these medicines the morning of surgery with A SIP OF WATER:  none   Do not wear jewelry, make-up or nail polish.  Do not wear lotions, powders, or perfumes. Do not wear deodorant.  Do not shave 48 hours prior to surgery.  Do not bring valuables to the hospital.  [facility] is not   responsible for any belongings or valuables brought to the hospital.  Contacts, dentures or bridgework may not be worn into surgery.  Leave suitcase in the car. After surgery it may be brought to your room.  For patients admitted to the hospital, checkout time is 11:00 AM the day of              discharge.      Please read over the following fact sheets that you were given:     Preparing for Surgery

## 2019-06-22 ENCOUNTER — Encounter (HOSPITAL_COMMUNITY): Payer: Self-pay

## 2019-06-23 LAB — GC/CHLAMYDIA PROBE AMP (~~LOC~~) NOT AT ARMC
Chlamydia: NEGATIVE
Comment: NEGATIVE
Comment: NORMAL
Neisseria Gonorrhea: NEGATIVE

## 2019-06-27 ENCOUNTER — Telehealth (INDEPENDENT_AMBULATORY_CARE_PROVIDER_SITE_OTHER): Payer: Medicaid Other | Admitting: Family Medicine

## 2019-06-27 ENCOUNTER — Other Ambulatory Visit: Payer: Self-pay

## 2019-06-27 DIAGNOSIS — Z3A38 38 weeks gestation of pregnancy: Secondary | ICD-10-CM

## 2019-06-27 DIAGNOSIS — Z98891 History of uterine scar from previous surgery: Secondary | ICD-10-CM

## 2019-06-27 DIAGNOSIS — O0990 Supervision of high risk pregnancy, unspecified, unspecified trimester: Secondary | ICD-10-CM

## 2019-06-27 DIAGNOSIS — D649 Anemia, unspecified: Secondary | ICD-10-CM

## 2019-06-27 DIAGNOSIS — O0993 Supervision of high risk pregnancy, unspecified, third trimester: Secondary | ICD-10-CM

## 2019-06-27 NOTE — Progress Notes (Signed)
I connected with  Carmelia Roller on 06/27/19 at  8:15 AM EST by telephone and verified that I am speaking with the correct person using two identifiers.   I discussed the limitations, risks, security and privacy concerns of performing an evaluation and management service by telephone and the availability of in person appointments. I also discussed with the patient that there may be a patient responsible charge related to this service. The patient expressed understanding and agreed to proceed.  Janene Madeira Autym Siess, CMA 06/27/2019  8:15 AM

## 2019-06-27 NOTE — Progress Notes (Signed)
TELEHEALTH OBSTETRICS PRENATAL VIRTUAL VIDEO VISIT ENCOUNTER NOTE  Provider location: [facility] [place]   I connected with Wendy Merritt on 06/27/19 at  8:15 AM EST by MyChart Video Encounter at home and verified that I am speaking with the correct person using two identifiers.   I discussed the limitations, risks, security and privacy concerns of performing an evaluation and management service virtually and the availability of in person appointments. I also discussed with the patient that there may be a patient responsible charge related to this service. The patient expressed understanding and agreed to proceed. Subjective:  Wendy Merritt is a 34 y.o. G6P5005 at [redacted]w[redacted]d being seen today for ongoing prenatal care.  She is currently monitored for the following issues for this high-risk pregnancy and has Supervision of high risk pregnancy, antepartum; History of C-section; Anemia; GBS bacteriuria; and Trichomonal infection on their problem list.  Patient reports no complaints.  Contractions: Not present. Vag. Bleeding: None.  Movement: Present. Denies any leaking of fluid.   The following portions of the patient's history were reviewed and updated as appropriate: allergies, current medications, past family history, past medical history, past social history, past surgical history and problem list.   Objective:   Vitals:   06/27/19 0815  BP: 112/71  Pulse: (!) 101    Fetal Status:     Movement: Present     General:  Alert, oriented and cooperative. Patient is in no acute distress.  Respiratory: Normal respiratory effort, no problems with respiration noted  Mental Status: Normal mood and affect. Normal behavior. Normal judgment and thought content.  Rest of physical exam deferred due to type of encounter  Imaging: [place] MFM OB DETAIL +14 WK  Result Date: 06/20/2019 ----------------------------------------------------------------------  OBSTETRICS REPORT                        (Signed Final 06/20/2019 02:00 pm) ---------------------------------------------------------------------- Patient Info  ID #:       [id number]                          D.O.B.:  October 17, 1985 (33 yrs)  Name:       Wendy Merritt                Visit Date: 06/20/2019 09:28 am ---------------------------------------------------------------------- Performed By  Performed By:     Hubert Azure          Ref. Address:     [address]                    [place],                                                             [place] [address]  Attending:        Tama High MD        [facility]  Referred By:      Armando Reichert CNM ---------------------------------------------------------------------- Orders   #  Description                          Code         Ordered By   1  [place] MFM OB DETAIL +14 WK              76811.01     KELLY DAVIS  ----------------------------------------------------------------------   #  Order #                    Accession #                 Episode #   1  [id number]                   [id number]                  [id number]  ---------------------------------------------------------------------- Indications   [redacted] weeks gestation of pregnancy                Z3A.37   History of cesarean delivery, currently        O34.219   pregnant (x5)   Encounter for antenatal screening for          Z36.3   malformations   Obesity complicating pregnancy, third          O99.213   trimester  ---------------------------------------------------------------------- Vital Signs  Weight (lb): 190                               Height:        5'2"  BMI:         34.75 ---------------------------------------------------------------------- Fetal Evaluation  Num Of Fetuses:         1  Fetal Heart Rate(bpm):  126  Cardiac Activity:       Observed   Presentation:           Cephalic  Placenta:               Left lateral  P. Cord Insertion:      Visualized, central  Amniotic Fluid  AFI FV:      Within normal limits  AFI Sum(cm)     %Tile       Largest Pocket(cm)  8.82            15          4.94  RUQ(cm)                     LUQ(cm)        LLQ(cm)  1.94                        4.94           1.94 ---------------------------------------------------------------------- Biometry  BPD:      86.2  mm     G. Age:  34w 5d         10  %    CI:        76.43   %    70 - 86                                                          FL/HC:      22.9   %    20.8 - 22.6  HC:      312.4  mm     G. Age:  35w 0d          2  %    HC/AC:      0.99        0.92 - 1.05  AC:      315.3  mm     G. Age:  35w 3d         21  %    FL/BPD:     82.9   %    71 - 87  FL:       71.5  mm     G. Age:  36w 4d         39  %    FL/AC:      22.7   %    20 - 24  Est. FW:    2730  gm           6 lb     22  % ---------------------------------------------------------------------- OB History  Gravidity:    6         Term:   5 ---------------------------------------------------------------------- Gestational Age  LMP:           37w 0d        Date:  10/04/18                 EDD:   07/11/19  U/S Today:     35w 3d                                        EDD:   07/22/19  Best:          37w 0d     Det. By:  LMP  (10/04/18)          EDD:   07/11/19 ---------------------------------------------------------------------- Anatomy  Cranium:               Appears normal         LVOT:                   Appears normal  Cavum:                 Appears normal         Aortic Arch:            Not well visualized  Ventricles:            Appears normal         Ductal Arch:            Not well visualized  Choroid Plexus:        Appears normal  Diaphragm:              Appears normal  Cerebellum:            Appears normal         Stomach:                Appears normal, left                                                                         sided  Posterior Fossa:       Not well visualized    Abdomen:                Appears normal  Nuchal Fold:           Not applicable (>20    Abdominal Wall:         Not well visualized                         wks GA)  Face:                  Orbits nl; profile not Cord Vessels:           Appears normal (3                         well visualized                                vessel cord)  Lips:                  Appears normal         Kidneys:                Appear normal  Palate:                Not well visualized    Bladder:                Appears normal  Thoracic:              Appears normal         Spine:                  Not well visualized  Heart:                 Appears normal         Upper Extremities:      Not well visualized                         (4CH, axis, and                         situs)  RVOT:                  Appears normal         Lower Extremities:      Appears normal  Other:  Fetus appears to be female. Nasal bone visualized. Technically  difficult due to advanced GA and fetal position. ---------------------------------------------------------------------- Impression  Patient transferred her prenatal [facility] to [facility].  Obstetric  history significant for five previous term cesarean deliveries.  Patient, apparently, had normal fetal anatomy scan in 2nd  trimester of her pregnancy.  She reports no chronic medical conditions.  On today's ultrasound, amniotic fluid is normal good fetal  activity seen.  Fetal growth is appropriate for gestational age.  Fetal anatomy appears normal but limited by advanced  gestational age.  Cephalic presentation.  Placenta is left  lateral and there is no evidence of previa or accreta.  We reassured the patient of the findings. ---------------------------------------------------------------------- Recommendations  Follow-up scans as clinically indicated. ----------------------------------------------------------------------                  Noralee Space,  MD Electronically Signed Final Report   06/20/2019 02:00 pm ----------------------------------------------------------------------   Assessment and Plan:  Pregnancy: H2C9470 at [redacted]w[redacted]d 1. Supervision of high risk pregnancy, antepartum Good fetal movement  2. History of C-section H/o c/s x5. Repeat at 39 weeks  3. Anemia, unspecified type On iron  Term labor symptoms and general obstetric precautions including but not limited to vaginal bleeding, contractions, leaking of fluid and fetal movement were reviewed in detail with the patient. I discussed the assessment and treatment plan with the patient. The patient was provided an opportunity to ask questions and all were answered. The patient agreed with the plan and demonstrated an understanding of the instructions. The patient was advised to call back or seek an in-person office evaluation/go to MAU at [facility] for any urgent or concerning symptoms. Please refer to After Visit Summary for other counseling recommendations.   I provided 11 minutes of face-to-face time during this encounter.  No follow-ups on file.  Future Appointments  Date Time Provider Department Center  07/02/2019  8:20 AM [facility] 1 [facility] None    Levie Heritage, DO [facility] [employer], [facility]

## 2019-07-02 ENCOUNTER — Other Ambulatory Visit (HOSPITAL_COMMUNITY)
Admission: RE | Admit: 2019-07-02 | Discharge: 2019-07-02 | Disposition: A | Discharge status: Home / Self Care | Payer: Medicaid Other | Source: Ambulatory Visit | Attending: Obstetrics and Gynecology | Admitting: Obstetrics and Gynecology

## 2019-07-02 ENCOUNTER — Other Ambulatory Visit: Payer: Self-pay

## 2019-07-02 DIAGNOSIS — Z20822 Contact with and (suspected) exposure to covid-19: Secondary | ICD-10-CM | POA: Insufficient documentation

## 2019-07-02 LAB — CBC
HCT: 34 % — ABNORMAL LOW (ref 36.0–46.0)
Hemoglobin: 10.1 g/dL — ABNORMAL LOW (ref 12.0–15.0)
MCH: 22.2 pg — ABNORMAL LOW (ref 26.0–34.0)
MCHC: 29.7 g/dL — ABNORMAL LOW (ref 30.0–36.0)
MCV: 74.9 fL — ABNORMAL LOW (ref 80.0–100.0)
Platelets: 271 10*3/uL (ref 150–400)
RBC: 4.54 MIL/uL (ref 3.87–5.11)
RDW: 19.6 % — ABNORMAL HIGH (ref 11.5–15.5)
WBC: 8.9 10*3/uL (ref 4.0–10.5)
nRBC: 0 % (ref 0.0–0.2)

## 2019-07-02 LAB — TYPE AND SCREEN
ABO/RH(D): O POS
Antibody Screen: NEGATIVE

## 2019-07-02 LAB — SARS CORONAVIRUS 2 (TAT 6-24 HRS): SARS Coronavirus 2: NEGATIVE

## 2019-07-02 LAB — RPR: RPR Ser Ql: NONREACTIVE

## 2019-07-02 LAB — ABO/RH: ABO/RH(D): O POS

## 2019-07-02 NOTE — MAU Note (Signed)
Pt reports to mau for covid testing and lab draw for section.  No complaints at this time.

## 2019-07-03 ENCOUNTER — Inpatient Hospital Stay (HOSPITAL_COMMUNITY): Payer: Medicaid Other | Admitting: Anesthesiology

## 2019-07-03 ENCOUNTER — Encounter (HOSPITAL_COMMUNITY): Payer: Self-pay | Admitting: Obstetrics and Gynecology

## 2019-07-03 ENCOUNTER — Encounter (HOSPITAL_COMMUNITY)
Admission: AD | Disposition: A | Discharge status: Home / Self Care | Payer: Self-pay | Source: Home / Self Care | Attending: Family Medicine

## 2019-07-03 ENCOUNTER — Inpatient Hospital Stay (HOSPITAL_COMMUNITY)
Admission: AD | Admit: 2019-07-03 | Discharge: 2019-07-05 | DRG: 787 | Disposition: A | Discharge status: Home / Self Care | Payer: Medicaid Other | Attending: Family Medicine | Admitting: Family Medicine

## 2019-07-03 ENCOUNTER — Other Ambulatory Visit: Payer: Self-pay

## 2019-07-03 DIAGNOSIS — N3289 Other specified disorders of bladder: Secondary | ICD-10-CM

## 2019-07-03 DIAGNOSIS — A599 Trichomoniasis, unspecified: Secondary | ICD-10-CM | POA: Diagnosis present

## 2019-07-03 DIAGNOSIS — O9902 Anemia complicating childbirth: Secondary | ICD-10-CM | POA: Diagnosis present

## 2019-07-03 DIAGNOSIS — O34211 Maternal care for low transverse scar from previous cesarean delivery: Principal | ICD-10-CM | POA: Diagnosis present

## 2019-07-03 DIAGNOSIS — D649 Anemia, unspecified: Secondary | ICD-10-CM | POA: Diagnosis present

## 2019-07-03 DIAGNOSIS — Z3A38 38 weeks gestation of pregnancy: Secondary | ICD-10-CM

## 2019-07-03 DIAGNOSIS — R8271 Bacteriuria: Secondary | ICD-10-CM | POA: Diagnosis present

## 2019-07-03 DIAGNOSIS — Z20822 Contact with and (suspected) exposure to covid-19: Secondary | ICD-10-CM | POA: Diagnosis present

## 2019-07-03 DIAGNOSIS — O26893 Other specified pregnancy related conditions, third trimester: Secondary | ICD-10-CM | POA: Diagnosis present

## 2019-07-03 DIAGNOSIS — Z98891 History of uterine scar from previous surgery: Secondary | ICD-10-CM

## 2019-07-03 HISTORY — DX: Other specified disorders of bladder: N32.89

## 2019-07-03 SURGERY — Surgical Case
Anesthesia: Spinal | Site: Abdomen | Wound class: Clean Contaminated

## 2019-07-03 MED ORDER — STERILE WATER FOR IRRIGATION IR SOLN
Status: DC | PRN
  Administered 2019-07-03: 1000 mL

## 2019-07-03 MED ORDER — FENTANYL CITRATE (PF) 100 MCG/2ML IJ SOLN
INTRAMUSCULAR | Status: DC | PRN
  Administered 2019-07-03: 15 ug via INTRATHECAL

## 2019-07-03 MED ORDER — MORPHINE SULFATE (PF) 0.5 MG/ML IJ SOLN
INTRAMUSCULAR | Status: DC | PRN
  Administered 2019-07-03: .15 mg via INTRATHECAL

## 2019-07-03 MED ORDER — ONDANSETRON HCL 4 MG/2ML IJ SOLN
INTRAMUSCULAR | Status: DC | PRN
  Administered 2019-07-03: 4 mg via INTRAVENOUS

## 2019-07-03 MED ORDER — FAMOTIDINE IN NACL 20-0.9 MG/50ML-% IV SOLN
INTRAVENOUS | Status: AC
  Administered 2019-07-03: 20 mg via INTRAVENOUS
  Filled 2019-07-03: qty 50

## 2019-07-03 MED ORDER — SIMETHICONE 80 MG PO CHEW
80.0000 mg | CHEWABLE_TABLET | Freq: Three times a day (TID) | ORAL | Status: DC
  Administered 2019-07-04 – 2019-07-05 (×4): 80 mg via ORAL
  Filled 2019-07-03 (×4): qty 1

## 2019-07-03 MED ORDER — BUPIVACAINE HCL (PF) 0.25 % IJ SOLN
INTRAMUSCULAR | Status: DC | PRN
  Administered 2019-07-03: 30 mL

## 2019-07-03 MED ORDER — ACETAMINOPHEN 325 MG PO TABS: 650.0000 mg | ORAL_TABLET | Freq: Four times a day (QID) | ORAL | Status: DC | PRN

## 2019-07-03 MED ORDER — OXYTOCIN 40 UNITS IN NORMAL SALINE INFUSION - SIMPLE MED
INTRAVENOUS | Status: DC | PRN
  Administered 2019-07-03: 40 [IU] via INTRAVENOUS

## 2019-07-03 MED ORDER — DIPHENHYDRAMINE HCL 25 MG PO CAPS
25.0000 mg | ORAL_CAPSULE | Freq: Four times a day (QID) | ORAL | Status: DC | PRN
  Administered 2019-07-03 – 2019-07-04 (×2): 25 mg via ORAL
  Filled 2019-07-03 (×2): qty 1

## 2019-07-03 MED ORDER — OXYTOCIN 40 UNITS IN NORMAL SALINE INFUSION - SIMPLE MED: 2.5000 [IU]/h | INTRAVENOUS | Status: AC

## 2019-07-03 MED ORDER — FAMOTIDINE IN NACL 20-0.9 MG/50ML-% IV SOLN: 20.0000 mg | Freq: Once | INTRAVENOUS | Status: AC

## 2019-07-03 MED ORDER — STERILE WATER FOR INJECTION IJ SOLN
INTRAMUSCULAR | Status: DC | PRN
  Administered 2019-07-03: 17:00:00 1000 mL

## 2019-07-03 MED ORDER — FENTANYL CITRATE (PF) 100 MCG/2ML IJ SOLN: 25.0000 ug | INTRAMUSCULAR | Status: DC | PRN

## 2019-07-03 MED ORDER — BUPIVACAINE IN DEXTROSE 0.75-8.25 % IT SOLN
INTRATHECAL | Status: DC | PRN
  Administered 2019-07-03: 1.6 mL via INTRATHECAL

## 2019-07-03 MED ORDER — LACTATED RINGERS IV BOLUS: 1000.0000 mL | Freq: Once | INTRAVENOUS | Status: DC

## 2019-07-03 MED ORDER — MEPERIDINE HCL 25 MG/ML IJ SOLN: 6.2500 mg | INTRAMUSCULAR | Status: DC | PRN

## 2019-07-03 MED ORDER — PHENYLEPHRINE 40 MCG/ML (10ML) SYRINGE FOR IV PUSH (FOR BLOOD PRESSURE SUPPORT)
PREFILLED_SYRINGE | INTRAVENOUS | Status: DC | PRN
  Administered 2019-07-03: 120 ug via INTRAVENOUS

## 2019-07-03 MED ORDER — MENTHOL 3 MG MT LOZG: 1.0000 | LOZENGE | OROMUCOSAL | Status: DC | PRN

## 2019-07-03 MED ORDER — OXYCODONE HCL 5 MG PO TABS
5.0000 mg | ORAL_TABLET | ORAL | Status: DC | PRN
  Administered 2019-07-04: 5 mg via ORAL
  Filled 2019-07-03: qty 1

## 2019-07-03 MED ORDER — PRENATAL MULTIVITAMIN CH
1.0000 | ORAL_TABLET | Freq: Every day | ORAL | Status: DC
  Administered 2019-07-04: 1 via ORAL
  Filled 2019-07-03: qty 1

## 2019-07-03 MED ORDER — COCONUT OIL OIL: 1.0000 "application " | TOPICAL_OIL | Status: DC | PRN

## 2019-07-03 MED ORDER — KETOROLAC TROMETHAMINE 30 MG/ML IJ SOLN
INTRAMUSCULAR | Status: AC
  Filled 2019-07-03: qty 1

## 2019-07-03 MED ORDER — WITCH HAZEL-GLYCERIN EX PADS: 1.0000 "application " | MEDICATED_PAD | CUTANEOUS | Status: DC | PRN

## 2019-07-03 MED ORDER — DEXAMETHASONE SODIUM PHOSPHATE 10 MG/ML IJ SOLN
INTRAMUSCULAR | Status: DC | PRN
  Administered 2019-07-03: 10 mg via INTRAVENOUS

## 2019-07-03 MED ORDER — CEFAZOLIN SODIUM-DEXTROSE 2-4 GM/100ML-% IV SOLN
2.0000 g | INTRAVENOUS | Status: AC
  Administered 2019-07-03: 2 g via INTRAVENOUS
  Filled 2019-07-03: qty 100

## 2019-07-03 MED ORDER — LACTATED RINGERS IV SOLN: INTRAVENOUS | Status: DC

## 2019-07-03 MED ORDER — TETANUS-DIPHTH-ACELL PERTUSSIS 5-2.5-18.5 LF-MCG/0.5 IM SUSP: 0.5000 mL | Freq: Once | INTRAMUSCULAR | Status: DC

## 2019-07-03 MED ORDER — SOD CITRATE-CITRIC ACID 500-334 MG/5ML PO SOLN
ORAL | Status: AC
  Administered 2019-07-03: 30 mL via ORAL
  Filled 2019-07-03: qty 15

## 2019-07-03 MED ORDER — SENNOSIDES-DOCUSATE SODIUM 8.6-50 MG PO TABS
2.0000 | ORAL_TABLET | ORAL | Status: DC
  Administered 2019-07-04 (×2): 2 via ORAL
  Filled 2019-07-03 (×3): qty 2

## 2019-07-03 MED ORDER — BUPIVACAINE HCL (PF) 0.25 % IJ SOLN
INTRAMUSCULAR | Status: AC
  Filled 2019-07-03: qty 30

## 2019-07-03 MED ORDER — ACETAMINOPHEN 160 MG/5ML PO SOLN: 1000.0000 mg | Freq: Once | ORAL | Status: DC

## 2019-07-03 MED ORDER — SODIUM CHLORIDE 0.9 % IV SOLN: INTRAVENOUS | Status: DC | PRN

## 2019-07-03 MED ORDER — SIMETHICONE 80 MG PO CHEW: 80.0000 mg | CHEWABLE_TABLET | ORAL | Status: DC | PRN

## 2019-07-03 MED ORDER — ACETAMINOPHEN 500 MG PO TABS: 1000.0000 mg | ORAL_TABLET | Freq: Once | ORAL | Status: DC

## 2019-07-03 MED ORDER — KETOROLAC TROMETHAMINE 30 MG/ML IJ SOLN
30.0000 mg | Freq: Four times a day (QID) | INTRAMUSCULAR | Status: AC
  Administered 2019-07-04 (×3): 30 mg via INTRAVENOUS
  Filled 2019-07-03 (×3): qty 1

## 2019-07-03 MED ORDER — SIMETHICONE 80 MG PO CHEW
80.0000 mg | CHEWABLE_TABLET | ORAL | Status: DC
  Administered 2019-07-04 (×2): 80 mg via ORAL
  Filled 2019-07-03 (×3): qty 1

## 2019-07-03 MED ORDER — ZOLPIDEM TARTRATE 5 MG PO TABS: 5.0000 mg | ORAL_TABLET | Freq: Every evening | ORAL | Status: DC | PRN

## 2019-07-03 MED ORDER — ENOXAPARIN SODIUM 40 MG/0.4ML ~~LOC~~ SOLN
40.0000 mg | SUBCUTANEOUS | Status: DC
  Filled 2019-07-03 (×3): qty 0.4

## 2019-07-03 MED ORDER — KETOROLAC TROMETHAMINE 30 MG/ML IJ SOLN
30.0000 mg | Freq: Once | INTRAMUSCULAR | Status: AC
  Administered 2019-07-03: 30 mg via INTRAVENOUS

## 2019-07-03 MED ORDER — IBUPROFEN 800 MG PO TABS
800.0000 mg | ORAL_TABLET | Freq: Four times a day (QID) | ORAL | Status: DC
  Administered 2019-07-04 – 2019-07-05 (×4): 800 mg via ORAL
  Filled 2019-07-03 (×5): qty 1

## 2019-07-03 MED ORDER — SODIUM CHLORIDE 0.9 % IR SOLN
Status: DC | PRN
  Administered 2019-07-03: 1000 mL

## 2019-07-03 MED ORDER — DIBUCAINE (PERIANAL) 1 % EX OINT: 1.0000 "application " | TOPICAL_OINTMENT | CUTANEOUS | Status: DC | PRN

## 2019-07-03 MED ORDER — PROMETHAZINE HCL 25 MG/ML IJ SOLN: 6.2500 mg | INTRAMUSCULAR | Status: DC | PRN

## 2019-07-03 MED ORDER — PHENYLEPHRINE HCL-NACL 20-0.9 MG/250ML-% IV SOLN
INTRAVENOUS | Status: DC | PRN
  Administered 2019-07-03: 90 ug/min via INTRAVENOUS
  Administered 2019-07-03: 60 ug/min via INTRAVENOUS

## 2019-07-03 MED ORDER — SOD CITRATE-CITRIC ACID 500-334 MG/5ML PO SOLN: 30.0000 mL | ORAL | Status: AC

## 2019-07-03 SURGICAL SUPPLY — 38 items
BENZOIN TINCTURE PRP APPL 2/3 (GAUZE/BANDAGES/DRESSINGS) ×3 IMPLANT
CANISTER SUCT 3000ML PPV (MISCELLANEOUS) ×3 IMPLANT
CHLORAPREP W/TINT 26ML (MISCELLANEOUS) ×3 IMPLANT
CLOSURE WOUND 1/2 X4 (GAUZE/BANDAGES/DRESSINGS) ×1
DRSG OPSITE POSTOP 4X10 (GAUZE/BANDAGES/DRESSINGS) ×3 IMPLANT
ELECT REM PT RETURN 9FT ADLT (ELECTROSURGICAL) ×3
ELECTRODE REM PT RTRN 9FT ADLT (ELECTROSURGICAL) ×1 IMPLANT
EXTRACTOR VACUUM KIWI (MISCELLANEOUS) ×3 IMPLANT
GLOVE BIOGEL PI IND STRL 7.0 (GLOVE) ×2 IMPLANT
GLOVE BIOGEL PI IND STRL 7.5 (GLOVE) ×1 IMPLANT
GLOVE BIOGEL PI INDICATOR 7.0 (GLOVE) ×4
GLOVE BIOGEL PI INDICATOR 7.5 (GLOVE) ×2
GLOVE SKINSENSE NS SZ7.0 (GLOVE) ×2
GLOVE SKINSENSE STRL SZ7.0 (GLOVE) ×1 IMPLANT
GOWN STRL REUS W/ TWL LRG LVL3 (GOWN DISPOSABLE) ×2 IMPLANT
GOWN STRL REUS W/ TWL XL LVL3 (GOWN DISPOSABLE) ×1 IMPLANT
GOWN STRL REUS W/TWL LRG LVL3 (GOWN DISPOSABLE) ×4
GOWN STRL REUS W/TWL XL LVL3 (GOWN DISPOSABLE) ×2
HEMOSTAT ARISTA ABSORB 3G PWDR (HEMOSTASIS) ×3 IMPLANT
NEEDLE BLUNT 18X1 FOR OR ONLY (NEEDLE) ×3 IMPLANT
NS IRRIG 1000ML POUR BTL (IV SOLUTION) ×3 IMPLANT
PACK C SECTION WH (CUSTOM PROCEDURE TRAY) ×3 IMPLANT
PAD ABD 7.5X8 STRL (GAUZE/BANDAGES/DRESSINGS) ×3 IMPLANT
PAD OB MATERNITY 4.3X12.25 (PERSONAL CARE ITEMS) ×3 IMPLANT
PAD PREP 24X48 CUFFED NSTRL (MISCELLANEOUS) ×3 IMPLANT
PENCIL SMOKE EVAC W/HOLSTER (ELECTROSURGICAL) ×3 IMPLANT
SPONGE LAP 18X18 RF (DISPOSABLE) ×6 IMPLANT
STRIP CLOSURE SKIN 1/2X4 (GAUZE/BANDAGES/DRESSINGS) ×2 IMPLANT
SUT MNCRL 0 VIOLET CTX 36 (SUTURE) ×2 IMPLANT
SUT MON AB 4-0 PS1 27 (SUTURE) ×3 IMPLANT
SUT MONOCRYL 0 CTX 36 (SUTURE) ×4
SUT PLAIN 2 0 XLH (SUTURE) ×3 IMPLANT
SUT VIC AB 0 CT1 36 (SUTURE) ×6 IMPLANT
SUT VIC AB 3-0 CT1 27 (SUTURE) ×2
SUT VIC AB 3-0 CT1 TAPERPNT 27 (SUTURE) ×1 IMPLANT
SYR 50ML LL SCALE MARK (SYRINGE) ×3 IMPLANT
TOWEL OR 17X24 6PK STRL BLUE (TOWEL DISPOSABLE) ×6 IMPLANT
WATER STERILE IRR 1000ML POUR (IV SOLUTION) ×3 IMPLANT

## 2019-07-03 NOTE — H&P (Signed)
  Wendy Merritt is an 34 y.o. [phone] 39w6dfemale.   Chief Complaint: contractions HPI: Prior C-section x 5. Contractions started today. Found to have painful contractions and a cervical exam of 2 cm/90. She is quite uncomfortable and having decels with each contraction.  Past Medical History:  Diagnosis Date  . Acid reflux   . Anemia   . Toothache     Past Surgical History:  Procedure Laterality Date  . CESAREAN SECTION      No family history on file. Social History:  reports that she has never smoked. She has never used smokeless tobacco. She reports that she does not drink alcohol or use drugs.    Allergies  Allergen Reactions  . Flagyl [Metronidazole] Rash    Medications Prior to Admission  Medication Sig Dispense Refill  . Aspirin-Caffeine 845-65 MG PACK Take 1 packet by mouth daily as needed (For pain.).    .Marland KitchenBlood Pressure Monitoring (BLOOD PRESSURE KIT) DEVI 1 Device by Does not apply route daily. ICD 10: Z34.00 1 each 0  . ferrous sulfate 324 (65 Fe) MG TBEC Take 1 tablet (325 mg total) by mouth every other day. 30 tablet 5  . Prenatal Vit-Fe Fumarate-FA (PRENATAL MULTIVITAMIN) TABS tablet Take 1 tablet by mouth daily at 12 noon.       A comprehensive review of systems was negative.  Blood pressure 115/73, pulse 85, temperature (!) 97.5 F (36.4 C), temperature source Oral, resp. rate 20, height '5\' 3"'$  (1.6 m), weight 86.2 kg, last menstrual period 10/04/2018, SpO2 100 %. General appearance: alert, cooperative and appears stated age Head: Normocephalic, without obvious abnormality, atraumatic Neck: no adenopathy, supple, symmetrical, trachea midline and thyroid not enlarged, symmetric, no tenderness/mass/nodules Lungs: normal effort Heart: regular rate and rhythm Abdomen: gravid, non-tender Extremities: extremities normal, atraumatic, no cyanosis or edema Skin: Skin color, texture, turgor normal. No rashes or lesions Neurologic: Grossly normal   Lab Results   Component Value Date   WBC 8.9 07/02/2019   HGB 10.1 (L) 07/02/2019   HCT 34.0 (L) 07/02/2019   MCV 74.9 (L) 07/02/2019   PLT 271 07/02/2019         ABO, Rh: --/--/O POS, O POS [facility]formed [facility] [address] [place] [address] [phone]  Antibody: NEG (02/20 0850)  Rubella:    RPR: NON REACTIVE (02/20 0912)  HBsAg: Negative (09/09 0000)  HIV: Non-reactive (09/09 0000)  GBS:   Positive in urine    Assessment/Plan Active Problems:   History of cesarean section   Normal labor  For RCS Risks include but are not limited to bleeding, infection, injury to surrounding structures, including bowel, bladder and ureters, blood clots, and death.  Likelihood of success is high.    TDonnamae Jude2/21/2021, 3:44 PM

## 2019-07-03 NOTE — MAU Note (Signed)
Wendy Merritt is a 34 y.o. at [redacted]w[redacted]d here in MAU reporting:  +contractions Started to get intense about 1 hour ago Schedule c-section for tomorrow Denies LOF or vaginal bleeding. Pain score: 9/10 +FM Lab orders placed from triage: mau labor triage

## 2019-07-03 NOTE — Anesthesia Preprocedure Evaluation (Addendum)
Anesthesia Evaluation  Patient identified by MRN, date of birth, ID band Patient awake    Reviewed: Allergy & Precautions, NPO status , Patient's Chart, lab work & pertinent test results  History of Anesthesia Complications Negative for: history of anesthetic complications  Airway Mallampati: II  TM Distance: >3 FB Neck ROM: Full    Dental no notable dental hx.    Pulmonary neg pulmonary ROS,    Pulmonary exam normal breath sounds clear to auscultation       Cardiovascular negative cardio ROS Normal cardiovascular exam Rhythm:Regular Rate:Normal     Neuro/Psych negative neurological ROS  negative psych ROS   GI/Hepatic Neg liver ROS, GERD  Medicated and Controlled,  Endo/Other  negative endocrine ROS  Renal/GU negative Renal ROS  negative genitourinary   Musculoskeletal negative musculoskeletal ROS (+)   Abdominal   Peds  Hematology  (+) anemia , Hgb 10.1, plt 271   Anesthesia Other Findings   Reproductive/Obstetrics (+) Pregnancy (Hx of C/S x1) 6 prior c sections                            Anesthesia Physical Anesthesia Plan  ASA: II and emergent  Anesthesia Plan: Spinal   Post-op Pain Management:    Induction:   PONV Risk Score and Plan: 3 and Treatment may vary due to age or medical condition, Dexamethasone and Ondansetron  Airway Management Planned: Natural Airway  Additional Equipment: None  Intra-op Plan:   Post-operative Plan:   Informed Consent: I have reviewed the patients History and Physical, chart, labs and discussed the procedure including the risks, benefits and alternatives for the proposed anesthesia with the patient or authorized representative who has indicated his/her understanding and acceptance.       Plan Discussed with: CRNA  Anesthesia Plan Comments: (Presented to MAU in labor, cervix dilating, starting to have some fetal decelerations. Has  had 6 prior C sections- will cross for 2 units)       Anesthesia Quick Evaluation

## 2019-07-03 NOTE — Anesthesia Procedure Notes (Signed)
Spinal  Patient location during procedure: OR Start time: 07/03/2019 4:00 PM End time: 07/03/2019 4:05 PM Staffing Performed: anesthesiologist  Anesthesiologist: Lannie Fields, DO Preanesthetic Checklist Completed: patient identified, IV checked, risks and benefits discussed, surgical consent, monitors and equipment checked, pre-op evaluation and timeout performed Spinal Block Patient position: sitting Prep: DuraPrep and site prepped and draped Patient monitoring: cardiac monitor, continuous pulse ox and blood pressure Approach: midline Location: L3-4 Injection technique: single-shot Needle Needle type: Pencan  Needle gauge: 24 G Needle length: 9 cm Assessment Sensory level: T6 Additional Notes Functioning IV was confirmed and monitors were applied. Sterile prep and drape, including hand hygiene and sterile gloves were used. The patient was positioned and the spine was prepped. The skin was anesthetized with lidocaine.  Free flow of clear CSF was obtained prior to injecting local anesthetic into the CSF.  The spinal needle aspirated freely following injection.  The needle was carefully withdrawn.  The patient tolerated the procedure well.

## 2019-07-03 NOTE — Op Note (Addendum)
Wendy Merritt PROCEDURE DATE: 07/03/2019  PREOPERATIVE DIAGNOSES: Intrauterine pregnancy at [redacted]w[redacted]d weeks gestation; 5 prior c-sections and laboring  POSTOPERATIVE DIAGNOSES: The same; Incidental Cystotomy   PROCEDURE: Repeat Low Transverse Cesarean Section, cystotomy repair  SURGEON:  Dr. Darron Doom - Primary Dr. Barrington Ellison - Fellow  ASSISTANT:  An experienced assistant was required given the standard of surgical care given the complexity of the case.  This assistant was needed for exposure, dissection, suctioning, retraction, instrument exchange, assisting with delivery with administration of fundal pressure and for overall help during the procedure.  ANESTHESIOLOGY TEAM: Anesthesiologist: Pervis Hocking, DO CRNA: [name], CRNA  INDICATIONS: Wendy Merritt is a 34 y.o. [phone] at [redacted]w[redacted]d here for cesarean section secondary to the indications listed under preoperative diagnoses; please see preoperative note for further details. Laboring.  The risks of cesarean section were discussed with the patient including but were not limited to: bleeding which may require transfusion or reoperation; infection which may require antibiotics; injury to bowel, bladder, ureters or other surrounding organs; injury to the fetus; need for additional procedures including hysterectomy in the event of a life-threatening hemorrhage; placental abnormalities wth subsequent pregnancies, incisional problems, thromboembolic phenomenon and other postoperative/anesthesia complications.   The patient concurred with the proposed plan, giving informed written consent for the procedure.    FINDINGS:  Viable female infant in cephalic presentation. Meconium-stained amniotic fluid.  Intact placenta, three vessel cord.  Normal uterus, fallopian tubes and ovaries bilaterally. Overall paucity of adhesions. Very thin lower uterine segment and bladder densely adhered to lower uterine segment. APGAR (1 MIN): 9   APGAR (5  MINS): 10   APGAR (10 MINS):    ANESTHESIA: Spinal INTRAVENOUS FLUIDS: 2300 ml   ESTIMATED BLOOD LOSS: 744 ml URINE OUTPUT:  75 ml SPECIMENS: Placenta sent to L&D COMPLICATIONS: None immediate  PROCEDURE IN DETAIL:  The patient preoperatively received intravenous antibiotics and had sequential compression devices applied to her lower extremities.  She was then taken to the operating room where spinal anesthesia was administered and was found to be adequate. She was then placed in a dorsal supine position with a leftward tilt, and prepped and draped in a sterile manner.  A foley catheter was placed into her bladder and attached to constant gravity.  After an adequate timeout was performed, a Pfannenstiel skin incision was made with scalpel on her preexisting scar and carried through to the underlying layer of fascia. The fascia was then bluntly dissected laterally. Kocher clamps were applied to the superior aspect of the fascial incision and the underlying rectus muscles were dissected off bluntly and sharply. The rectus muscles were separated in the midline and the peritoneum was entered bluntly. The Alexis self-retaining retractor was introduced into the abdominal cavity.  Attention was turned to the lower uterine segment where a low transverse hysterotomy was made with a scalpel and extended bilaterally bluntly. Of note, the bladder was noted to be well inferior to the uterine incision. Lower uterine segment was noted to be very thin and only one pass with scalpel needed before entering bluntly. Upon entering, meconium-fluid noted. Both arms exited the hysterotomy and had to be placed back inside. Difficult extraction of fetal head due to low station in laboring uterus. The infant was successfully delivered, the cord was clamped and cut after one minute, and the infant was handed over to the awaiting neonatology team. Uterine massage was then administered, and the placenta delivered intact with a  three-vessel cord. The uterus was then  cleared of clots and debris and incidental cystotomy noted as foley bulb present in field. Cystotomy closed with 2-0 Monocryl in a running suture and then imbricating layer with same suture. Bladder back-filled with sterile milk and no leakages noted. The hysterotomy was closed with 0 Monocryl in a running locked fashion, and an imbricating layer was also placed with 0 Monocryl. Figure-of-eight 0 Vicryl serosal stitches were placed to help with hemostasis, along with Arista.  The pelvis was cleared of all clot and debris. Hemostasis was confirmed on all surfaces. The retractor was removed. The fascia was then closed using 0 Vicryl in a running fashion. 30 mL 0.25% Marcaine injected around incision site. The skin was closed with a 4-0 Vicryl subcuticular stitch. The patient tolerated the procedure well. Sponge, instrument and needle counts were correct x 3.  She was taken to the recovery room in stable condition.   Jerilynn Birkenhead, MD [facility] Fellow, [facility] for [employer], [facility]

## 2019-07-03 NOTE — Anesthesia Postprocedure Evaluation (Signed)
Anesthesia Post Note  Patient: Wendy Merritt  Procedure(s) Performed: CESAREAN SECTION (N/A Abdomen)     Patient location during evaluation: PACU Anesthesia Type: Spinal Level of consciousness: awake and alert and oriented Pain management: pain level controlled Vital Signs Assessment: post-procedure vital signs reviewed and stable Respiratory status: spontaneous breathing, nonlabored ventilation and respiratory function stable Cardiovascular status: blood pressure returned to baseline and stable Postop Assessment: no headache, no backache and spinal receding Anesthetic complications: no    Last Vitals:  Vitals:   07/03/19 1720 07/03/19 1730  BP: 105/69 109/63  Pulse: 89 91  Resp: 11 18  Temp:    SpO2: 100% 100%    Last Pain:  Vitals:   07/03/19 1506  TempSrc: Oral  PainSc:    Pain Goal: Patients Stated Pain Goal: 0 (07/03/19 1456)              Epidural/Spinal Function Cutaneous sensation: Able to Wiggle Toes (07/03/19 1720), Patient able to flex knees: No(can slightly bend bilateral knees) (07/03/19 1720), Patient able to lift hips off bed: No (07/03/19 1720), Back pain beyond tenderness at insertion site: No (07/03/19 1720), Progressively worsening motor and/or sensory loss: No (07/03/19 1720), Bowel and/or bladder incontinence post epidural: No (07/03/19 1720)  Lannie Fields

## 2019-07-03 NOTE — Discharge Summary (Addendum)
Postpartum Discharge Summary     Patient Name: Wendy Merritt DOB: 1985/10/03 MRN: [id number]  Date of admission: 07/03/2019 Delivering Provider: Donnamae Jude   Date of discharge: 07/05/2019  Admitting diagnosis: History of cesarean section [Z98.891] Intrauterine pregnancy: [redacted]w[redacted]d    Secondary diagnosis:  Principal Problem:   History of cesarean section Active Problems:   Anemia   GBS bacteriuria   Trichomonal infection   Normal labor   Bladder rupture  Additional problems: Cystotomy and repair     Discharge diagnosis: Term Pregnancy Delivered                                                                                                Post partum procedures: None  Augmentation:  NA  Complications: Incidental cystotomy during c-section  Hospital course:  Sceduled C/S   34y.o. yo [id number] admitted to the hospital 07/03/2019 for cesarean section as she arrived to [facility] laboring with history of 5 prior c-sections. Membrane Rupture Time/Date: 4:17 PM ,07/03/2019   Patient delivered a Viable infant.07/03/2019  Details of operation can be found in separate operative note. Of note, incidental cystotomy during c-section. Foley catheter left in place and to be removed in 10 days. Pateint had an uncomplicated postpartum course.  She is ambulating, tolerating a regular diet, passing flatus, and urinating well. Patient is discharged home in stable condition on  07/05/19        Delivery time: 4:18 PM    Magnesium Sulfate received: No BMZ received: No Rhophylac:No MMR:No Transfusion:No  Physical exam  Vitals:   07/04/19 1522 07/04/19 2045 07/05/19 0540 07/05/19 1411  BP: 118/80 104/68 108/67 108/66  Pulse: 91 87 76 100  Resp: 16 16 18 16   Temp: 97.7 F (36.5 C) 98.5 F (36.9 C) 97.6 F (36.4 C) 98.8 F (37.1 C)  TempSrc: Oral Oral Axillary Axillary  SpO2: 100% 100%  100%  Weight:      Height:       General: alert, cooperative and no distress Lochia:  appropriate Uterine Fundus: firm Incision: Dressing is clean, dry, and intact DVT Evaluation: No evidence of DVT seen on physical exam. Labs: Lab Results  Component Value Date   WBC 16.7 (H) 07/04/2019   HGB 8.0 (L) 07/04/2019   HCT 26.4 (L) 07/04/2019   MCV 74.4 (L) 07/04/2019   PLT 262 07/04/2019   CMP Latest Ref Rng & Units 07/04/2019  Creatinine 0.44 - 1.00 mg/dL 0.69   Edinburgh Score: Edinburgh Postnatal Depression Scale Screening Tool 07/04/2019  I have been able to laugh and see the funny side of things. 0  I have looked forward with enjoyment to things. 0  I have blamed myself unnecessarily when things went wrong. 0  I have been anxious or worried for no good reason. 0  I have felt scared or panicky for no good reason. 0  Things have been getting on top of me. 0  I have been so unhappy that I have had difficulty sleeping. 0  I have felt sad or miserable. 0  I have been so unhappy  that I have been crying. 0  The thought of harming myself has occurred to me. 0  Edinburgh Postnatal Depression Scale Total 0    Discharge instruction: per After Visit Summary and "Baby and Me Booklet".  After visit meds:  Allergies as of 07/05/2019       Reactions   Flagyl [metronidazole] Rash        Medication List     STOP taking these medications    Aspirin-Caffeine 845-65 MG Pack   Blood Pressure Kit Devi       TAKE these medications    ibuprofen 800 MG tablet Commonly known as: ADVIL Take 1 tablet (800 mg total) by mouth every 8 (eight) hours as needed.   prenatal multivitamin Tabs tablet Take 1 tablet by mouth daily at 12 noon.   UNABLE TO FIND Take 10 mLs by mouth daily. Floradix Iron+Herbs        Diet: routine diet  Activity: Advance as tolerated. Pelvic rest for 6 weeks.   Outpatient follow up:4 weeks Follow up Appt: Future Appointments  Date Time Provider [place]  07/14/2019  8:30 AM Bowie Vermillion  08/04/2019  3:55 PM Chancy Milroy, MD [facility]   Follow up Visit:     Please schedule this patient for Postpartum visit in: 4 weeks with the following provider: MD In-Person For C/S patients schedule nurse incision check in weeks 2 weeks: yes High risk pregnancy complicated by:  5 prior sections Delivery mode:  CS Anticipated Birth Control:  POPs; requested visit in person at 4 wks as patient considering IUD insertion PP Procedures needed:  incision check and Foley catheter removal to be scheduled on 07/14/2019   Schedule Integrated [place] visit: no     Newborn Data: Live born female  Birth Weight: 3025g  APGAR: 9, 10  Newborn Delivery   Birth date/time: 07/03/2019 16:18:00 Delivery type: C-Section, Low Transverse Trial of labor: No C-section categorization: Repeat      Baby Feeding: Breast Disposition:home with mother   07/05/2019 Lurline Del, DO  I personally saw and evaluated the patient, performing the key elements of the service. I developed and verified the management plan that is described in the resident's/student's note, and I agree with the content with my edits above. VSS, HRR&R, Resp unlabored, Legs neg.  Nigel Berthold, CNM 07/07/2019 10:14 AM

## 2019-07-03 NOTE — Transfer of Care (Signed)
Immediate Anesthesia Transfer of Care Note  Patient: Wendy Merritt  Procedure(s) Performed: CESAREAN SECTION (N/A Abdomen)  Patient Location: PACU  Anesthesia Type:Spinal  Level of Consciousness: awake  Airway & Oxygen Therapy: Patient Spontanous Breathing  Post-op Assessment: Report given to RN  Post vital signs: Reviewed and stable  Last Vitals:  Vitals Value Taken Time  BP 105/69 07/03/19 1720  Temp    Pulse 90 07/03/19 1722  Resp 21 07/03/19 1722  SpO2 100 % 07/03/19 1722  Vitals shown include unvalidated device data.  Last Pain:  Vitals:   07/03/19 1506  TempSrc: Oral  PainSc:       Patients Stated Pain Goal: 0 (07/03/19 1456)  Complications: No apparent anesthesia complications

## 2019-07-04 ENCOUNTER — Inpatient Hospital Stay (HOSPITAL_COMMUNITY)
Admission: RE | Admit: 2019-07-04 | Payer: Medicaid Other | Source: Home / Self Care | Admitting: Obstetrics and Gynecology

## 2019-07-04 LAB — CREATININE, SERUM
Creatinine, Ser: 0.69 mg/dL (ref 0.44–1.00)
GFR calc Af Amer: >60 mL/min (ref >60)
GFR calc non Af Amer: >60 mL/min (ref >60)

## 2019-07-04 LAB — CBC
HCT: 26.4 % — ABNORMAL LOW (ref 36.0–46.0)
Hemoglobin: 8 g/dL — ABNORMAL LOW (ref 12.0–15.0)
MCH: 22.5 pg — ABNORMAL LOW (ref 26.0–34.0)
MCHC: 30.3 g/dL (ref 30.0–36.0)
MCV: 74.4 fL — ABNORMAL LOW (ref 80.0–100.0)
Platelets: 262 10*3/uL (ref 150–400)
RBC: 3.55 MIL/uL — ABNORMAL LOW (ref 3.87–5.11)
RDW: 19 % — ABNORMAL HIGH (ref 11.5–15.5)
WBC: 16.7 10*3/uL — ABNORMAL HIGH (ref 4.0–10.5)
nRBC: 0 % (ref 0.0–0.2)

## 2019-07-04 MED ORDER — FERROUS SULFATE 325 (65 FE) MG PO TABS
325.0000 mg | ORAL_TABLET | ORAL | Status: DC
  Administered 2019-07-04: 325 mg via ORAL
  Filled 2019-07-04 (×2): qty 1

## 2019-07-04 MED ORDER — POLYETHYLENE GLYCOL 3350 17 G PO PACK
17.0000 g | PACK | Freq: Every day | ORAL | Status: DC
  Administered 2019-07-04: 17 g via ORAL
  Filled 2019-07-04: qty 1

## 2019-07-04 MED ORDER — POLYSACCHARIDE IRON COMPLEX 150 MG PO CAPS: 150.0000 mg | ORAL_CAPSULE | Freq: Every day | ORAL | Status: DC

## 2019-07-04 NOTE — Plan of Care (Signed)
  Problem: Education: Goal: Knowledge of General Education information will improve Description: Including pain rating scale, medication(s)/side effects and non-pharmacologic comfort measures Outcome: Completed/Met   Problem: Clinical Measurements: Goal: Ability to maintain clinical measurements within normal limits will improve Outcome: Completed/Met Goal: Will remain free from infection Outcome: Completed/Met Goal: Diagnostic test results will improve Outcome: Completed/Met Goal: Respiratory complications will improve Outcome: Completed/Met Goal: Cardiovascular complication will be avoided Outcome: Completed/Met   Problem: Activity: Goal: Risk for activity intolerance will decrease Outcome: Completed/Met   Problem: Nutrition: Goal: Adequate nutrition will be maintained Outcome: Completed/Met   Problem: Elimination: Goal: Will not experience complications related to urinary retention Outcome: Completed/Met   Problem: Pain Managment: Goal: General experience of comfort will improve Outcome: Completed/Met   Problem: Safety: Goal: Ability to remain free from injury will improve Outcome: Completed/Met   Problem: Skin Integrity: Goal: Risk for impaired skin integrity will decrease Outcome: Completed/Met   Problem: Education: Goal: Knowledge of condition will improve Outcome: Completed/Met   Problem: Activity: Goal: Will verbalize the importance of balancing activity with adequate rest periods Outcome: Completed/Met Goal: Ability to tolerate increased activity will improve Outcome: Completed/Met   Problem: Life Cycle: Goal: Chance of risk for complications during the postpartum period will decrease Outcome: Completed/Met   Problem: Role Relationship: Goal: Ability to demonstrate positive interaction with newborn will improve Outcome: Completed/Met   Problem: Skin Integrity: Goal: Demonstration of wound healing without infection will improve Outcome:  Completed/Met   

## 2019-07-04 NOTE — Lactation Note (Signed)
This note was copied from a baby's chart. Lactation Consultation Note  Patient Name: Wendy Merritt [id number] Date: 07/04/2019  [name] now 55 hours old.  Mom reports recent void.   Mom inquired about what the best thing to do as far as breastfeeding goes.  To breastfeed? To Pump?  To breastfeed on one side and pump on the other?  Mom reports she purchased a [employer] DEBP.  Inquired about moms breastfeeding goals.  Mom reports she has always stopped breastfeeding about 3-6 months due to work but with this Wendy she will be staying home and working from home.   Urged mom to breastfeed on demand/cue and at least 8-12 or more times day.  Discussed waiting until 3-6 weeks to start pumping or bottles and then to only do occasional pumping past breastfeeds and only give when needed since she would be staying home this time.  Mom reports she knows how to hand express.Mom reports she feels she is breastfeeding well but she was kind of worried because she had not voided.  Discussed adding some hand expression and spoon feeding to breastfeeding. Urged mom to hand express and feed back small amounts of colostrum via spoon past breastfeeds.  Urged mom to call lactation as needed.       Maternal Data    Feeding Feeding Type: Breast Fed  [facility] Score                   Interventions    Lactation Tools Discussed/Used     Consult Status      Serene Kopf Michaelle Copas 07/04/2019, 12:31 PM

## 2019-07-04 NOTE — Plan of Care (Signed)
  Problem: Education: Goal: Knowledge of General Education information will improve Description: Including pain rating scale, medication(s)/side effects and non-pharmacologic comfort measures Outcome: Completed/Met   Problem: Clinical Measurements: Goal: Ability to maintain clinical measurements within normal limits will improve Outcome: Completed/Met Goal: Will remain free from infection Outcome: Completed/Met Goal: Diagnostic test results will improve Outcome: Completed/Met Goal: Respiratory complications will improve Outcome: Completed/Met Goal: Cardiovascular complication will be avoided Outcome: Completed/Met   Problem: Activity: Goal: Risk for activity intolerance will decrease Outcome: Completed/Met   Problem: Nutrition: Goal: Adequate nutrition will be maintained Outcome: Completed/Met   Problem: Elimination: Goal: Will not experience complications related to urinary retention Outcome: Completed/Met   Problem: Pain Managment: Goal: General experience of comfort will improve Outcome: Completed/Met   Problem: Safety: Goal: Ability to remain free from injury will improve Outcome: Completed/Met   Problem: Skin Integrity: Goal: Risk for impaired skin integrity will decrease Outcome: Completed/Met   Problem: Education: Goal: Knowledge of General Education information will improve Description: Including pain rating scale, medication(s)/side effects and non-pharmacologic comfort measures Outcome: Completed/Met   Problem: Clinical Measurements: Goal: Ability to maintain clinical measurements within normal limits will improve Outcome: Completed/Met Goal: Will remain free from infection Outcome: Completed/Met Goal: Diagnostic test results will improve Outcome: Completed/Met Goal: Respiratory complications will improve Outcome: Completed/Met Goal: Cardiovascular complication will be avoided Outcome: Completed/Met   Problem: Activity: Goal: Risk for activity  intolerance will decrease Outcome: Completed/Met   Problem: Nutrition: Goal: Adequate nutrition will be maintained Outcome: Completed/Met   Problem: Elimination: Goal: Will not experience complications related to urinary retention Outcome: Completed/Met   Problem: Pain Managment: Goal: General experience of comfort will improve Outcome: Completed/Met   Problem: Safety: Goal: Ability to remain free from injury will improve Outcome: Completed/Met   Problem: Skin Integrity: Goal: Risk for impaired skin integrity will decrease Outcome: Completed/Met   Problem: Education: Goal: Knowledge of General Education information will improve Description: Including pain rating scale, medication(s)/side effects and non-pharmacologic comfort measures Outcome: Completed/Met   Problem: Clinical Measurements: Goal: Ability to maintain clinical measurements within normal limits will improve Outcome: Completed/Met Goal: Will remain free from infection Outcome: Completed/Met Goal: Diagnostic test results will improve Outcome: Completed/Met Goal: Respiratory complications will improve Outcome: Completed/Met Goal: Cardiovascular complication will be avoided Outcome: Completed/Met   Problem: Activity: Goal: Risk for activity intolerance will decrease Outcome: Completed/Met   Problem: Nutrition: Goal: Adequate nutrition will be maintained Outcome: Completed/Met   Problem: Elimination: Goal: Will not experience complications related to urinary retention Outcome: Completed/Met   Problem: Pain Managment: Goal: General experience of comfort will improve Outcome: Completed/Met   Problem: Safety: Goal: Ability to remain free from injury will improve Outcome: Completed/Met   Problem: Skin Integrity: Goal: Risk for impaired skin integrity will decrease Outcome: Completed/Met

## 2019-07-04 NOTE — Lactation Note (Addendum)
This note was copied from a baby's chart. Lactation Consultation noted  Baby 11 hrs old. Mom states that baby is BF well to the Rt. Breast but has a hard time latching baby to the Lt. LC assisted baby in latching to the Lt. Breast. Baby wouldn't open mouth wide. LC assessed baby suck w/gloved finger. Baby has high palate, bites . Finally got baby suckling, once got baby suckling well, switched baby to the breast. Baby has recessed chin, needs chin tug. This is mom's 6th baby. Mom stated she BF her other children between 3-6 months. The oldest is 2 and youngest is 30 yrs old. Mom has everted nipples. Hand expression w/a few dots of colostrum noted. Encouraged mom to cont. To write I&O and feed STS like she is doing. Mom has no support person at bedside. Newborn feeding habits discussed. Mom encouraged to feed baby 8-12 times/24 hours and with feeding cues.  Mom stated she doesn't have any questions at this time. Encouraged mom to call for assistance or questions. Lactation brochure given.  Patient Name: Wendy Merritt    WYOVZ'C Date: 07/04/2019 Reason for consult: Initial assessment;Early term 37-38.6wks    Maternal Data Has patient been taught Hand Expression?: Yes Does the patient have breastfeeding experience prior to this delivery?: Yes  Feeding Feeding Type: Breast Fed  LATCH Score Latch: Grasps breast easily, tongue down, lips flanged, rhythmical sucking.  Audible Swallowing: A few with stimulation  Type of Nipple: Everted at rest and after stimulation  Comfort (Breast/Nipple): Soft / non-tender  Hold (Positioning): Assistance needed to correctly position infant at breast and maintain latch.  LATCH Score: 8  Interventions Interventions: Breast feeding basics reviewed;Support pillows;Assisted with latch;Position options;Skin to skin;Breast massage;Hand express;Breast compression;Adjust position  Lactation Tools Discussed/Used WIC Program: Yes   Consult  Status Consult Status: Follow-up Date: 07/04/18 Follow-up type: In-patient    Charyl Dancer 07/04/2019, 3:23 AM

## 2019-07-04 NOTE — Progress Notes (Signed)
Subjective: Postpartum Day 1: Cesarean Delivery Patient reports feeling well and virtually no pain. She has not yet passed flatus. She is tolerating PO and ambulating without assistance. Minimal uterine cramping with breastfeeding.   Objective: Vital signs in last 24 hours: Temp:  [97.5 F (36.4 C)-98.5 F (36.9 C)] 98.5 F (36.9 C) (02/22 0459) Pulse Rate:  [67-113] 71 (02/22 0217) Resp:  [11-20] 16 (02/22 0217) BP: (100-116)/(54-73) 116/72 (02/22 0217) SpO2:  [87 %-100 %] 100 % (02/21 1830) Weight:  [86.2 kg] 86.2 kg (02/21 1500)  Physical Exam:  General: alert, cooperative, appears stated age and no distress Lochia: appropriate Uterine Fundus: firm Incision: healing well DVT Evaluation: No evidence of DVT seen on physical exam.  Recent Labs    07/02/19 0912 07/04/19 0628  HGB 10.1* 8.0*  HCT 34.0* 26.4*    Assessment/Plan: Status post Cesarean section. Doing well postoperatively.  Continue current care. Discussed cystostomy in detail. Minimal UOP this morning but 1000 cc after flushing and patient reports feeling a lot better. Will have patient receive leg bag training prior to DC. Also discussed birth control and risk of future pregnancies in detail. Patient is considering IUD at [facility] visit and also wants to talk to her partner more about vasectomy. Reports they are done having children and she understands the seriousness a future pregnancy could bring. Asymptomatic; PO iron initiated and will continue on discharge.  Vitals stable.  Plan for DC 2/23 or 2/24 Appt requested 3/4 for Foley removal and voiding trial  Will request 4-6 wk PP visit be in person for possible IUD placement   Joselyn Arrow 07/04/2019, 9:13 AM

## 2019-07-05 ENCOUNTER — Encounter (HOSPITAL_COMMUNITY): Payer: Self-pay | Admitting: Obstetrics and Gynecology

## 2019-07-05 MED ORDER — IBUPROFEN 800 MG PO TABS: 800.0000 mg | ORAL_TABLET | Freq: Three times a day (TID) | ORAL | 0 refills | Status: DC | PRN

## 2019-07-05 NOTE — Lactation Note (Signed)
This note was copied from a baby's chart. Lactation Consultation Note  Patient Name: Girl Mychelle Kendra [id number] Date: 07/05/2019   P6, ExBF.  Baby 45 hours old.  6.6% weight loss. Voids/stools WNL. Mother recently bf for 1/2 hour.   Reviewed hand pump use and suggest mother supplement with her breastmilk after feedings via spoon. Demonstrated how to spoon feed.   Mother denies questions or concerns.   Baby has been cluster feeding.  Mother not interested in DEBP at this time.      Maternal Data    Feeding Feeding Type: Breast Milk  LATCH Score                   Interventions Interventions: Pre-pump if needed;Hand pump  Lactation Tools Discussed/Used     Consult Status      Hardie Pulley 07/05/2019, 1:22 PM

## 2019-07-05 NOTE — Progress Notes (Signed)
[place] changed without any difficulty.

## 2019-07-05 NOTE — Progress Notes (Addendum)
POSTPARTUM PROGRESS NOTE  Subjective: Wendy Merritt is a 34 y.o. [id number] s/p LTCS at [redacted]w[redacted]d  She reports she doing well. No acute events overnight. She denies any problems with ambulating, voiding or po intake. Denies nausea or vomiting. She has passed flatus. Pain is well controlled.  Lochia is minimal. She states she has been urinating more since yesterday.  Objective: Blood pressure 108/67, pulse 76, temperature 97.6 F (36.4 C), temperature source Axillary, resp. rate 18, height 5' 3"  (1.6 m), weight 86.2 kg, last menstrual period 10/04/2018, SpO2 100 %, unknown if currently breastfeeding.  Physical Exam:  General: alert, cooperative and no distress Chest: CTA, no respiratory distress Abdomen: soft, bandage present with some dried blood on bandage but no ongoing bleeding Uterine Fundus: firm, appropriately tender Extremities: No calf swelling or tenderness  Recent Labs    07/02/19 0912 07/04/19 0628  HGB 10.1* 8.0*  HCT 34.0* 26.4*    Assessment/Plan: Wendy Maretis a 34y.o. [id number]/p LCTS at [redacted]w[redacted]d Routine Postpartum Care: Doing well, pain well-controlled.  -- Continue routine care, lactation support  -- Contraception: 6 week follow up - POPs vs IUD -- Feeding: Breast  Dispo: Plan for discharge likely tomorrow.  Wendy DelDO Resident, [facility]  I personally saw and evaluated the patient, performing the key elements of the service. I developed and verified the management plan that is described in the resident's/student's note, and I agree with the content with my edits above. VSS, HRR&R, Resp unlabored, Legs neg.  Wendy BertholdCNM 07/07/2019 10:14 AM

## 2019-07-05 NOTE — Discharge Instructions (Signed)

## 2019-07-14 ENCOUNTER — Other Ambulatory Visit: Payer: Self-pay

## 2019-07-14 ENCOUNTER — Ambulatory Visit (INDEPENDENT_AMBULATORY_CARE_PROVIDER_SITE_OTHER): Payer: Medicaid Other | Admitting: *Deleted

## 2019-07-14 VITALS — BP 136/90 | HR 89 | Wt 176.6 lb

## 2019-07-14 DIAGNOSIS — R030 Elevated blood-pressure reading, without diagnosis of hypertension: Secondary | ICD-10-CM

## 2019-07-14 NOTE — Progress Notes (Signed)
11:20 Voided 875 cc pink tinged urine.  Advised if she has trouble urinating at home to call us if during office hours or go to hospital if after hours. She voices understanding. Enes Wegener,RN

## 2019-07-14 NOTE — Progress Notes (Signed)
Here for wound check and foley removal.  Pt. On zoom call with son in [place] that she could not miss.  BP 136/90 with repeat 134 89. 1+ edema noted in ankles. C/o headache last night 8, took ibuprofen with relief. Reports noted urine in catheter had red specks on Sunday 07/10/19 then Monday 3/1 night had dark orange/red/ yellow urine which resolved by Tuesday am.   Reported BP's today, c/o headache x1 , 1= edema to Dr. Vergie Living. Will get cbc, cmet today.  Repeat bp check 1 wk. Reported patient report of urine color change , which is normal clear yellow today. Instructed to call Dr. Shawnie Pons. Called Dr. Shawnie Pons and she ordered to remove catheter today , have patient drink water, then void when feels urge and measure. If unable to urine will reinsert catheter.  Incison clean, dry, intact with steristrips. Dampeded steristrips with saline and removed all but 2.  Incision clean , dry, intact. Instructed patient to keep clean and dry and remove last 2 steristrips after next shower and to inspect wound daily and call us if issues.  Removed catheter without difficulty. Gave patient water and instructed to notify desk when ready to void.  Maple Odaniel,RN

## 2019-07-14 NOTE — Progress Notes (Signed)
10:20 Voided approximately 25 cc red bloody urine with blood clot noted. Given more water and sent to car to wait until needs to void again. Eidan Muellner,RN

## 2019-07-14 NOTE — Progress Notes (Signed)
Patient seen and assessed by nursing staff during this encounter. I have reviewed the chart and agree with the documentation and plan.  Spencer Bing, MD 07/14/2019 2:22 PM

## 2019-07-15 LAB — COMPREHENSIVE METABOLIC PANEL
ALT: 29 IU/L (ref 0–32)
AST: 25 IU/L (ref 0–40)
Albumin/Globulin Ratio: 1.2 (ref 1.2–2.2)
Albumin: 3.9 g/dL (ref 3.8–4.8)
Alkaline Phosphatase: 100 IU/L (ref 39–117)
BUN/Creatinine Ratio: 16 (ref 9–23)
BUN: 10 mg/dL (ref 6–20)
Bilirubin Total: 0.6 mg/dL (ref 0.0–1.2)
CO2: 18 mmol/L — ABNORMAL LOW (ref 20–29)
Calcium: 8.8 mg/dL (ref 8.7–10.2)
Chloride: 104 mmol/L (ref 96–106)
Creatinine, Ser: 0.64 mg/dL (ref 0.57–1.00)
GFR calc Af Amer: 136 mL/min/{1.73_m2} (ref >59)
GFR calc non Af Amer: 118 mL/min/{1.73_m2} (ref >59)
Globulin, Total: 3.2 g/dL (ref 1.5–4.5)
Glucose: 95 mg/dL (ref 65–99)
Potassium: 3.8 mmol/L (ref 3.5–5.2)
Sodium: 138 mmol/L (ref 134–144)
Total Protein: 7.1 g/dL (ref 6.0–8.5)

## 2019-07-15 LAB — CBC
Hematocrit: 30.1 % — ABNORMAL LOW (ref 34.0–46.6)
Hemoglobin: 9.2 g/dL — ABNORMAL LOW (ref 11.1–15.9)
MCH: 22.7 pg — ABNORMAL LOW (ref 26.6–33.0)
MCHC: 30.6 g/dL — ABNORMAL LOW (ref 31.5–35.7)
MCV: 74 fL — ABNORMAL LOW (ref 79–97)
Platelets: 287 10*3/uL (ref 150–450)
RBC: 4.05 x10E6/uL (ref 3.77–5.28)
RDW: 19.6 % — ABNORMAL HIGH (ref 11.7–15.4)
WBC: 9.6 10*3/uL (ref 3.4–10.8)

## 2019-07-22 ENCOUNTER — Ambulatory Visit: Payer: Medicaid Other

## 2019-08-04 ENCOUNTER — Other Ambulatory Visit (HOSPITAL_COMMUNITY)
Admission: RE | Admit: 2019-08-04 | Discharge: 2019-08-04 | Disposition: A | Discharge status: Home / Self Care | Payer: Medicaid Other | Source: Ambulatory Visit | Attending: Obstetrics and Gynecology | Admitting: Obstetrics and Gynecology

## 2019-08-04 ENCOUNTER — Ambulatory Visit (INDEPENDENT_AMBULATORY_CARE_PROVIDER_SITE_OTHER): Payer: Medicaid Other | Admitting: Obstetrics and Gynecology

## 2019-08-04 ENCOUNTER — Other Ambulatory Visit: Payer: Self-pay

## 2019-08-04 ENCOUNTER — Encounter: Payer: Self-pay | Admitting: Obstetrics and Gynecology

## 2019-08-04 VITALS — BP 102/71 | HR 115 | Ht 63.0 in | Wt 169.8 lb

## 2019-08-04 DIAGNOSIS — Z113 Encounter for screening for infections with a predominantly sexual mode of transmission: Secondary | ICD-10-CM | POA: Insufficient documentation

## 2019-08-04 DIAGNOSIS — N898 Other specified noninflammatory disorders of vagina: Secondary | ICD-10-CM | POA: Insufficient documentation

## 2019-08-04 DIAGNOSIS — Z309 Encounter for contraceptive management, unspecified: Secondary | ICD-10-CM | POA: Insufficient documentation

## 2019-08-04 DIAGNOSIS — Z30011 Encounter for initial prescription of contraceptive pills: Secondary | ICD-10-CM

## 2019-08-04 LAB — POCT URINALYSIS DIP (DEVICE)
Bilirubin Urine: NEGATIVE
Glucose, UA: NEGATIVE mg/dL
Ketones, ur: NEGATIVE mg/dL
Nitrite: POSITIVE — AB
Protein, ur: NEGATIVE mg/dL
Specific Gravity, Urine: 1.025 (ref 1.005–1.030)
Urobilinogen, UA: 0.2 mg/dL (ref 0.0–1.0)
pH: 5.5 (ref 5.0–8.0)

## 2019-08-04 MED ORDER — NORETHINDRONE 0.35 MG PO TABS: 1.0000 | ORAL_TABLET | Freq: Every day | ORAL | 11 refills | Status: DC

## 2019-08-04 NOTE — Progress Notes (Signed)
Subjective:     Wendy Merritt is a 34 y.o. female who presents for a postpartum visit. She is 4 weeks postpartum following a low cervical transverse Cesarean section. I have fully reviewed the prenatal and intrapartum course. The delivery was at 38/6 gestational weeks. Outcome: repeat cesarean section, low transverse incision. Anesthesia: spinal. Postpartum course has been unremarkable. Baby's course has been unremarkable. Baby is feeding by breast. Bleeding no bleeding. Bowel function is normal. Bladder function is normal. Patient is not sexually active. Contraception method is none. Postpartum depression screening: negative.  The following portions of the patient's history were reviewed and updated as appropriate: allergies, current medications, past family history, past medical history, past social history, past surgical history and problem list.  Review of Systems Pertinent items noted in HPI and remainder of comprehensive ROS otherwise negative.   Objective:    LMP 10/04/2018 (Approximate)   General:  alert   Breasts:  not examined  Lungs: clear to auscultation bilaterally  Heart:  regular rate and rhythm, S1, S2 normal, no murmur, click, rub or gallop  Abdomen: soft, non-tender; bowel sounds normal; no masses,  no organomegaly, incision well healed, scar tissue noted   Vulva:  not evaluated  Vagina: not evaluated  Cervix:  not examined  Corpus: not examined  Adnexa:  not evaluated  Rectal Exam: Not performed.        Assessment:     Nl postpartum exam. Pap smear not done at today's visit.   Plan:    1. Contraception: oral progesterone-only contraceptive 2. Return to nl ADL's 3. Follow up in: 1 yr or as needed.

## 2019-08-04 NOTE — Patient Instructions (Signed)
Health Maintenance, Female Adopting a healthy lifestyle and getting preventive care are important in promoting health and wellness. Ask your health care provider about:  The right schedule for you to have regular tests and exams.  Things you can do on your own to prevent diseases and keep yourself healthy. What should I know about diet, weight, and exercise? Eat a healthy diet   Eat a diet that includes plenty of vegetables, fruits, low-fat dairy products, and lean protein.  Do not eat a lot of foods that are high in solid fats, added sugars, or sodium. Maintain a healthy weight Body mass index (BMI) is used to identify weight problems. It estimates body fat based on height and weight. Your health care provider can help determine your BMI and help you achieve or maintain a healthy weight. Get regular exercise Get regular exercise. This is one of the most important things you can do for your health. Most adults should:  Exercise for at least 150 minutes each week. The exercise should increase your heart rate and make you sweat (moderate-intensity exercise).  Do strengthening exercises at least twice a week. This is in addition to the moderate-intensity exercise.  Spend less time sitting. Even light physical activity can be beneficial. Watch cholesterol and blood lipids Have your blood tested for lipids and cholesterol at 34 years of age, then have this test every 5 years. Have your cholesterol levels checked more often if:  Your lipid or cholesterol levels are high.  You are older than 34 years of age.  You are at high risk for heart disease. What should I know about cancer screening? Depending on your health history and family history, you may need to have cancer screening at various ages. This may include screening for:  Breast cancer.  Cervical cancer.  Colorectal cancer.  Skin cancer.  Lung cancer. What should I know about heart disease, diabetes, and high blood  pressure? Blood pressure and heart disease  High blood pressure causes heart disease and increases the risk of stroke. This is more likely to develop in people who have high blood pressure readings, are of African descent, or are overweight.  Have your blood pressure checked: ? Every 3-5 years if you are 18-39 years of age. ? Every year if you are 40 years old or older. Diabetes Have regular diabetes screenings. This checks your fasting blood sugar level. Have the screening done:  Once every three years after age 40 if you are at a normal weight and have a low risk for diabetes.  More often and at a younger age if you are overweight or have a high risk for diabetes. What should I know about preventing infection? Hepatitis B If you have a higher risk for hepatitis B, you should be screened for this virus. Talk with your health care provider to find out if you are at risk for hepatitis B infection. Hepatitis C Testing is recommended for:  Everyone born from 1945 through 1965.  Anyone with known risk factors for hepatitis C. Sexually transmitted infections (STIs)  Get screened for STIs, including gonorrhea and chlamydia, if: ? You are sexually active and are younger than 34 years of age. ? You are older than 34 years of age and your health care provider tells you that you are at risk for this type of infection. ? Your sexual activity has changed since you were last screened, and you are at increased risk for chlamydia or gonorrhea. Ask your health care provider if   you are at risk.  Ask your health care provider about whether you are at high risk for HIV. Your health care provider may recommend a prescription medicine to help prevent HIV infection. If you choose to take medicine to prevent HIV, you should first get tested for HIV. You should then be tested every 3 months for as long as you are taking the medicine. Pregnancy  If you are about to stop having your period (premenopausal) and  you may become pregnant, seek counseling before you get pregnant.  Take 400 to 800 micrograms (mcg) of folic acid every day if you become pregnant.  Ask for birth control (contraception) if you want to prevent pregnancy. Osteoporosis and menopause Osteoporosis is a disease in which the bones lose minerals and strength with aging. This can result in bone fractures. If you are 65 years old or older, or if you are at risk for osteoporosis and fractures, ask your health care provider if you should:  Be screened for bone loss.  Take a calcium or vitamin D supplement to lower your risk of fractures.  Be given hormone replacement therapy (HRT) to treat symptoms of menopause. Follow these instructions at home: Lifestyle  Do not use any products that contain nicotine or tobacco, such as cigarettes, e-cigarettes, and chewing tobacco. If you need help quitting, ask your health care provider.  Do not use street drugs.  Do not share needles.  Ask your health care provider for help if you need support or information about quitting drugs. Alcohol use  Do not drink alcohol if: ? Your health care provider tells you not to drink. ? You are pregnant, may be pregnant, or are planning to become pregnant.  If you drink alcohol: ? Limit how much you use to 0-1 drink a day. ? Limit intake if you are breastfeeding.  Be aware of how much alcohol is in your drink. In the [place] one drink equals one 12 oz bottle of beer (355 mL), one 5 oz glass of wine (148 mL), or one 1 oz glass of hard liquor (44 mL). General instructions  Schedule regular health, dental, and eye exams.  Stay current with your vaccines.  Tell your health care provider if: ? You often feel depressed. ? You have ever been abused or do not feel safe at home. Summary  Adopting a healthy lifestyle and getting preventive care are important in promoting health and wellness.  Follow your health care provider's instructions about healthy  diet, exercising, and getting tested or screened for diseases.  Follow your health care provider's instructions on monitoring your cholesterol and blood pressure. This information is not intended to replace advice given to you by your health care provider. Make sure you discuss any questions you have with your health care provider. Document Revised: 04/21/2018 Document Reviewed: 04/21/2018 Elsevier Patient Education  2020 Elsevier Inc.  

## 2019-08-04 NOTE — Progress Notes (Signed)
Pt wants BC Pills. 

## 2019-08-08 LAB — CERVICOVAGINAL ANCILLARY ONLY
Bacterial Vaginitis (gardnerella): POSITIVE — AB
Candida Glabrata: NEGATIVE
Candida Vaginitis: NEGATIVE
Chlamydia: NEGATIVE
Comment: NEGATIVE
Comment: NEGATIVE
Comment: NEGATIVE
Comment: NEGATIVE
Comment: NEGATIVE
Comment: NORMAL
Neisseria Gonorrhea: NEGATIVE
Trichomonas: NEGATIVE

## 2019-08-10 ENCOUNTER — Telehealth (INDEPENDENT_AMBULATORY_CARE_PROVIDER_SITE_OTHER): Payer: Medicaid Other | Admitting: Lactation Services

## 2019-08-10 DIAGNOSIS — N760 Acute vaginitis: Secondary | ICD-10-CM

## 2019-08-10 DIAGNOSIS — B9689 Other specified bacterial agents as the cause of diseases classified elsewhere: Secondary | ICD-10-CM

## 2019-08-10 MED ORDER — CLINDAMYCIN HCL 300 MG PO CAPS: 300.0000 mg | ORAL_CAPSULE | Freq: Three times a day (TID) | ORAL | 0 refills | Status: AC

## 2019-08-10 NOTE — Telephone Encounter (Signed)
Called patient and she reports she has had BV in the past. She had Flagyl in November and tolerated it well. Reviewed with Dr. Shawnie Pons that patient has an allergy to Flagyl, and she recommended Clindamycin. Patient was informed that prescrpition will be sent to her Pharmacy today.

## 2019-08-11 ENCOUNTER — Telehealth: Payer: Self-pay | Admitting: *Deleted

## 2019-08-11 DIAGNOSIS — B9689 Other specified bacterial agents as the cause of diseases classified elsewhere: Secondary | ICD-10-CM

## 2019-08-11 MED ORDER — CLINDAMYCIN PHOSPHATE 2 % VA CREA: 1.0000 | TOPICAL_CREAM | Freq: Every day | VAGINAL | 0 refills | Status: AC

## 2019-08-11 NOTE — Telephone Encounter (Signed)
Wendy Merritt left a message she just picked up a RX that was sent in yesterday. She would like to know if there is an alternative since she is breastfeeding. . I called Wendy Merritt and she picked up the Clindamycin PO for BV . She states pharmacist told her it would bother her baby's stomach and would like to know if she can take something else. Discussed with Dr. Shawnie Pons. Ordered Cleocin gel to take vaginally. I explained to Wendy Merritt this is less likely to bother her baby. She voices understanding. Gay Moncivais,RN

## 2019-09-28 ENCOUNTER — Telehealth (INDEPENDENT_AMBULATORY_CARE_PROVIDER_SITE_OTHER): Payer: Medicaid Other | Admitting: General Practice

## 2019-09-28 DIAGNOSIS — N39490 Overflow incontinence: Secondary | ICD-10-CM

## 2019-09-28 NOTE — Telephone Encounter (Signed)
Called patient regarding prior auth fax received from patient's pharmacy for clindamycin cream. Patient states she still hasn't picked it up but will get it today and pay out of pocket. She states since delivery she hasn't had a period yet & has been leaking urine at random times. Patient states the leakage is improving some compared to before and isn't all her urine but she has to constantly wear pads. Patient states the leakage happens at random times and isn't limited to position changes or sneezing/coughing. Asked patient if she is on birth control or breastfeeding. Patient states she hasn't started the birth control pills yet and is exclusively breastfeeding. Discussed with her that patients who exclusively breastfeed usually do not have a period due to the hormones, so it is very much normal she hasn't had a cycle yet. Patient verbalized understanding. Discussed with Wendy Merritt patient's reports of incontinence and Wendy Merritt recommends pelvic PT & MD visit. Discussed with patient. Told patient someone from PT will call her with an appt in our office and one of our front office staff will call her with an appt to see one of our MDs. Patient verbalized understanding. Referral order placed.

## 2019-10-13 ENCOUNTER — Ambulatory Visit (HOSPITAL_COMMUNITY)
Admission: EM | Admit: 2019-10-13 | Discharge: 2019-10-13 | Disposition: A | Discharge status: Home / Self Care | Payer: Medicaid Other | Attending: Emergency Medicine | Admitting: Emergency Medicine

## 2019-10-13 ENCOUNTER — Other Ambulatory Visit: Payer: Self-pay

## 2019-10-13 ENCOUNTER — Telehealth: Payer: Self-pay | Admitting: *Deleted

## 2019-10-13 ENCOUNTER — Encounter (HOSPITAL_COMMUNITY): Payer: Self-pay | Admitting: Emergency Medicine

## 2019-10-13 DIAGNOSIS — H0015 Chalazion left lower eyelid: Secondary | ICD-10-CM | POA: Diagnosis not present

## 2019-10-13 DIAGNOSIS — H1032 Unspecified acute conjunctivitis, left eye: Secondary | ICD-10-CM | POA: Diagnosis not present

## 2019-10-13 MED ORDER — POLYMYXIN B-TRIMETHOPRIM 10000-0.1 UNIT/ML-% OP SOLN: 1.0000 [drp] | OPHTHALMIC | 0 refills | Status: AC

## 2019-10-13 MED ORDER — TETRACAINE HCL 0.5 % OP SOLN
OPHTHALMIC | Status: AC
  Filled 2019-10-13: qty 4

## 2019-10-13 NOTE — Discharge Instructions (Signed)
Viral Conjunctivitis- this is self-limiting and will improve on its own, may worsen for 3-5 days, but should resolve in 10-14 days  - Have Good Hand Hygiene  - Use Cold Compresses  - Use Polytrim eye drops (2 drops 4 times a day for 5-7 days) to cover for any bacterial cause

## 2019-10-13 NOTE — Telephone Encounter (Signed)
Received auto message from [facility] stating that pt had not yet read the message sent on 09/28/19 regarding upcoming appointment on 6/11 @ 10;15 am. I called pt and left VM message with date and time of appt as well as our new address and phone number. She may call back if she needs to change the appt or has questions.
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
## 2019-10-13 NOTE — ED Triage Notes (Signed)
Pt c/o left eye pain and redness onset x 2 weeks ago. Pt states originally problem started with a small bump she believes was a stye and progressively got worse. She is concerned about infection due to the redness.

## 2019-10-21 ENCOUNTER — Ambulatory Visit: Payer: Medicaid Other | Admitting: Obstetrics and Gynecology

## 2020-01-31 ENCOUNTER — Encounter (HOSPITAL_COMMUNITY): Payer: Self-pay | Admitting: Emergency Medicine

## 2020-01-31 ENCOUNTER — Other Ambulatory Visit: Payer: Self-pay

## 2020-01-31 ENCOUNTER — Ambulatory Visit (HOSPITAL_COMMUNITY)
Admission: EM | Admit: 2020-01-31 | Discharge: 2020-01-31 | Disposition: A | Discharge status: Home / Self Care | Payer: Medicaid Other | Attending: Family Medicine | Admitting: Family Medicine

## 2020-01-31 DIAGNOSIS — K047 Periapical abscess without sinus: Secondary | ICD-10-CM | POA: Diagnosis not present

## 2020-01-31 MED ORDER — AMOXICILLIN 500 MG PO CAPS: 1000.0000 mg | ORAL_CAPSULE | Freq: Two times a day (BID) | ORAL | 0 refills | Status: DC

## 2020-01-31 NOTE — ED Notes (Signed)
Called pt x1 no answer

## 2020-01-31 NOTE — ED Triage Notes (Signed)
Pt c/o right lower back dental pain. Pt states she has a 81 month old and is breast feeding and is not sure what to take. She has been taking goody powders.

## 2020-01-31 NOTE — Discharge Instructions (Addendum)
Take the antibiotics as prescribed for dental infection.  You to ibuprofen for pain. Contacts given for dental follow-up [facility] Address: [address] [place], [place], [address] Phone: [phone]   - Dr. Lawrence Marseilles Address: [address], [place], [place], [address] Phone: [phone]

## 2020-02-01 NOTE — ED Provider Notes (Signed)
[facility]    CSN: [id number] Arrival date & time: 01/31/20  0841      History   Chief Complaint Chief Complaint  Patient presents with   Dental Pain    HPI Wendy Merritt is a 34 y.o. female.   Patient is a 34 year old female who presents today with right lower dental pain.  This has been constant worsening over the past 3 days with gingival swelling.  History of similar symptoms.  Knows that she needs to have this tooth removed.  Has been taking Goody powders.  Patient is currently breast-feeding.  No fevers, chills or trismus.     Past Medical History:  Diagnosis Date   Acid reflux    Anemia    Bladder rupture 07/03/2019   Supervision of high risk pregnancy, antepartum 03/28/2019    Nursing Staff Provider Office Location  [facility] Dating  15wk [place] Language  English Anatomy US  Nml Flu Vaccine  Declined-05/19/19 Genetic Screen  NIPS:   AFP:   First Screen:  Quad:   TDaP vaccine   Declined-05/19/19 Hgb A1C or  GTT 1hr normal 03/14/2019 Third trimester  Rhogam  n/a   LAB RESULTS  Feeding Plan Breast Blood Type O/Positive/-- (09/09 0000)  Contraception OCPs Antibody Negative (09/09 0000)    Toothache     Patient Active Problem List   Diagnosis Date Noted   Screening examination for STD (sexually transmitted disease) 08/04/2019   Postpartum care following cesarean delivery 08/04/2019   Contraception management 08/04/2019   History of cesarean section 03/28/2019   Anemia     Past Surgical History:  Procedure Laterality Date   CESAREAN SECTION     CESAREAN SECTION N/A 07/03/2019   Procedure: CESAREAN SECTION;  Surgeon: Reva Bores, MD;  Location: [facility];  Service: Obstetrics;  Laterality: N/A;    OB History    Gravida  6   Para  6   Term  6   Preterm      AB      Living  6     SAB      TAB      Ectopic      Multiple  0   Live Births  6            Home Medications    Prior to Admission medications   Medication Sig Start  Date End Date Taking? Authorizing Provider  amoxicillin (AMOXIL) 500 MG capsule Take 2 capsules (1,000 mg total) by mouth 2 (two) times daily. 01/31/20   Dahlia Byes A, NP  ibuprofen (ADVIL) 800 MG tablet Take 1 tablet (800 mg total) by mouth every 8 (eight) hours as needed. 07/05/19   Jackelyn Poling, DO  norethindrone (MICRONOR) 0.35 MG tablet Take 1 tablet (0.35 mg total) by mouth daily. 08/04/19   Hermina Staggers, MD  Prenatal Vit-Fe Fumarate-FA (PRENATAL MULTIVITAMIN) TABS tablet Take 1 tablet by mouth daily at 12 noon.    [provider]  UNABLE TO FIND Take 10 mLs by mouth daily. Floradix Iron+Herbs    [provider]    Family History History reviewed. No pertinent family history.  Social History Social History   Tobacco Use   Smoking status: Never Smoker   Smokeless tobacco: Never Used  Building services engineer Use: Never used  Substance Use Topics   Alcohol use: No   Drug use: No     Allergies   Flagyl [metronidazole]   Review of Systems Review of  Systems   Physical Exam Triage Vital Signs ED Triage Vitals  Enc Vitals Group     BP 01/31/20 1017 122/79     Pulse Rate 01/31/20 1017 87     Resp 01/31/20 1017 15     Temp 01/31/20 1017 98.6 F (37 C)     Temp Source 01/31/20 1017 Oral     SpO2 01/31/20 1017 98 %     Weight --      Height --      Head Circumference --      Peak Flow --      Pain Score 01/31/20 1034 0     Pain Loc --      Pain Edu? --      Excl. in GC? --    No data found.  Updated Vital Signs BP 122/79 (BP Location: Left Arm)    Pulse 87    Temp 98.6 F (37 C) (Oral)    Resp 15    SpO2 98%    Breastfeeding Yes   Visual Acuity Right Eye Distance:   Left Eye Distance:   Bilateral Distance:    Right Eye Near:   Left Eye Near:    Bilateral Near:     Physical Exam Vitals and nursing note reviewed.  Constitutional:      General: She is not in acute distress.    Appearance: Normal appearance. She is not ill-appearing,  toxic-appearing or diaphoretic.  HENT:     Head: Normocephalic.     Nose: Nose normal.     Mouth/Throat:     Pharynx: Oropharynx is clear.      Comments: Tooth partially broken off with surrounding gingival swelling and erythema Eyes:     Conjunctiva/sclera: Conjunctivae normal.  Pulmonary:     Effort: Pulmonary effort is normal.  Musculoskeletal:        General: Normal range of motion.     Cervical back: Normal range of motion.  Skin:    General: Skin is warm and dry.     Findings: No rash.  Neurological:     Mental Status: She is alert.  Psychiatric:        Mood and Affect: Mood normal.      UC Treatments / Results  Labs (all labs ordered are listed, but only abnormal results are displayed) Labs Reviewed - No data to display  EKG   Radiology No results found.  Procedures Procedures (including critical care time)  Medications Ordered in UC Medications - No data to display  Initial Impression / Assessment and Plan / UC Course  I have reviewed the triage vital signs and the nursing notes.  Pertinent labs & imaging results that were available during my care of the patient were reviewed by me and considered in my medical decision making (see chart for details).     Dental infection Treated with amoxicillin.  Recommend ibuprofen for pain as needed.  Discontinue the Goody's due to breast-feeding Dental resources given Follow up as needed for continued or worsening symptoms  Final Clinical Impressions(s) / UC Diagnoses   Final diagnoses:  Dental infection     Discharge Instructions     Take the antibiotics as prescribed for dental infection.  You to ibuprofen for pain. Contacts given for dental follow-up [facility] Address: [address], [place], [place], [address] Phone: [phone]   - Dr. Lawrence Marseilles Address: [address], [place], [place], [address] Phone: [phone]       ED  Prescriptions    Medication Sig  Dispense Auth. Provider   amoxicillin (AMOXIL) 500 MG capsule Take 2 capsules (1,000 mg total) by mouth 2 (two) times daily. 40 capsule Nachum Derossett A, NP     PDMP not reviewed this encounter.   Dahlia Byes A, NP 02/01/20 1418

## 2020-11-22 ENCOUNTER — Encounter (HOSPITAL_COMMUNITY): Payer: Self-pay

## 2020-11-22 ENCOUNTER — Ambulatory Visit (HOSPITAL_COMMUNITY)
Admission: EM | Admit: 2020-11-22 | Discharge: 2020-11-22 | Disposition: A | Discharge status: Home / Self Care | Payer: Medicaid Other | Attending: Internal Medicine | Admitting: Internal Medicine

## 2020-11-22 ENCOUNTER — Other Ambulatory Visit: Payer: Self-pay

## 2020-11-22 DIAGNOSIS — R519 Headache, unspecified: Secondary | ICD-10-CM

## 2020-11-22 DIAGNOSIS — K047 Periapical abscess without sinus: Secondary | ICD-10-CM

## 2020-11-22 MED ORDER — AMOXICILLIN 875 MG PO TABS: 875.0000 mg | ORAL_TABLET | Freq: Two times a day (BID) | ORAL | 0 refills | Status: AC

## 2020-11-22 NOTE — ED Triage Notes (Signed)
Pt presents with dental infection and swelling on right side for past few days.

## 2020-11-22 NOTE — ED Provider Notes (Signed)
[facility]    CSN: [id number] Arrival date & time: 11/22/20  1801      History   Chief Complaint Chief Complaint  Patient presents with   Dental Problem    HPI Wendy Merritt is a 35 y.o. female.   Patient presents to the urgent care with 3-day history of right dental pain and swelling.  Patient has also had associated headaches due to swelling of face.  Patient denies any current pain but states that she feels a "tightness and swelling" on the right side of face.  Denies any trauma to head or face.  Denies any known fevers at home.    Past Medical History:  Diagnosis Date   Acid reflux    Anemia    Bladder rupture 07/03/2019   Supervision of high risk pregnancy, antepartum 03/28/2019    Nursing Staff Provider Office Location  [facility] Dating  15wk [place] Language  English Anatomy US  Nml Flu Vaccine  Declined-05/19/19 Genetic Screen  NIPS:   AFP:   First Screen:  Quad:   TDaP vaccine   Declined-05/19/19 Hgb A1C or  GTT 1hr normal 03/14/2019 Third trimester  Rhogam  n/a   LAB RESULTS  Feeding Plan Breast Blood Type O/Positive/-- (09/09 0000)  Contraception OCPs Antibody Negative (09/09 0000)    Toothache     Patient Active Problem List   Diagnosis Date Noted   Screening examination for STD (sexually transmitted disease) 08/04/2019   Postpartum care following cesarean delivery 08/04/2019   Contraception management 08/04/2019   History of cesarean section 03/28/2019   Anemia     Past Surgical History:  Procedure Laterality Date   CESAREAN SECTION     CESAREAN SECTION N/A 07/03/2019   Procedure: CESAREAN SECTION;  Surgeon: Reva Bores, MD;  Location: [facility];  Service: Obstetrics;  Laterality: N/A;    OB History     Gravida  6   Para  6   Term  6   Preterm      AB      Living  6      SAB      IAB      Ectopic      Multiple  0   Live Births  6            Home Medications    Prior to Admission medications   Medication Sig Start Date  End Date Taking? Authorizing Provider  amoxicillin (AMOXIL) 875 MG tablet Take 1 tablet (875 mg total) by mouth 2 (two) times daily for 10 days. 11/22/20 12/02/20 Yes Lance Muss, FNP  ibuprofen (ADVIL) 800 MG tablet Take 1 tablet (800 mg total) by mouth every 8 (eight) hours as needed. 07/05/19   Jackelyn Poling, DO  norethindrone (MICRONOR) 0.35 MG tablet Take 1 tablet (0.35 mg total) by mouth daily. 08/04/19   Hermina Staggers, MD  Prenatal Vit-Fe Fumarate-FA (PRENATAL MULTIVITAMIN) TABS tablet Take 1 tablet by mouth daily at 12 noon.    [provider]  UNABLE TO FIND Take 10 mLs by mouth daily. Floradix Iron+Herbs    [provider]    Family History History reviewed. No pertinent family history.  Social History Social History   Tobacco Use   Smoking status: Never   Smokeless tobacco: Never  Vaping Use   Vaping Use: Never used  Substance Use Topics   Alcohol use: No   Drug use: No     Allergies   Flagyl [metronidazole]  Review of Systems Review of Systems Per HPI  Physical Exam Triage Vital Signs ED Triage Vitals [11/22/20 1933]  Enc Vitals Group     BP 116/83     Pulse Rate (!) 102     Resp 18     Temp 98.3 F (36.8 C)     Temp Source Oral     SpO2 98 %     Weight      Height      Head Circumference      Peak Flow      Pain Score      Pain Loc      Pain Edu?      Excl. in GC?    No data found.  Updated Vital Signs BP 116/83 (BP Location: Right Arm)   Pulse (!) 102   Temp 98.3 F (36.8 C) (Oral)   Resp 18   SpO2 98%   Visual Acuity Right Eye Distance:   Left Eye Distance:   Bilateral Distance:    Right Eye Near:   Left Eye Near:    Bilateral Near:     Physical Exam Constitutional:      Appearance: Normal appearance.  HENT:     Head: Normocephalic and atraumatic.     Mouth/Throat:     Dentition: Abnormal dentition. Dental tenderness and gingival swelling present.      Comments: Patient has impacted right tooth with  surrounding erythema and swelling.  Patient also has mild facial swelling noted to the right side. Eyes:     Extraocular Movements: Extraocular movements intact.     Conjunctiva/sclera: Conjunctivae normal.  Pulmonary:     Effort: Pulmonary effort is normal.  Musculoskeletal:     Cervical back: Normal range of motion. No tenderness.  Skin:    General: Skin is warm and dry.  Neurological:     General: No focal deficit present.     Mental Status: She is alert and oriented to person, place, and time. Mental status is at baseline.  Psychiatric:        Mood and Affect: Mood normal.        Behavior: Behavior normal.        Thought Content: Thought content normal.        Judgment: Judgment normal.     UC Treatments / Results  Labs (all labs ordered are listed, but only abnormal results are displayed) Labs Reviewed - No data to display  EKG   Radiology No results found.  Procedures Procedures (including critical care time)  Medications Ordered in UC Medications - No data to display  Initial Impression / Assessment and Plan / UC Course  I have reviewed the triage vital signs and the nursing notes.  Pertinent labs & imaging results that were available during my care of the patient were reviewed by me and considered in my medical decision making (see chart for details).     Will treat dental abscess with amoxicillin x10 days.  Patient advised to use warm compresses as well.  Patient to use Tylenol as needed for pain and headache due to patient currently breast-feeding.  Suspect headache related to swelling and abscess.  Patient advised of the importance to follow-up with dentist soon as possible for further evaluation and management. Discussed strict return precautions. Patient verbalized understanding and is agreeable with plan.  Final Clinical Impressions(s) / UC Diagnoses   Final diagnoses:  Dental abscess  Acute nonintractable headache, unspecified headache type      Discharge  Instructions      You are being treated for dental abscess with amoxicillin antibiotic.  You may use warm compresses to the affected area of your face for 15 minutes at a time 2-3 times daily.  You may take Tylenol for pain and headache due to you currently breast-feeding.  Please follow-up with dentist as soon as possible for further evaluation and management.     ED Prescriptions     Medication Sig Dispense Auth. Provider   amoxicillin (AMOXIL) 875 MG tablet Take 1 tablet (875 mg total) by mouth 2 (two) times daily for 10 days. 20 tablet Lance Muss, FNP      PDMP not reviewed this encounter.   Lance Muss, FNP 11/22/20 1954

## 2020-11-22 NOTE — Discharge Instructions (Addendum)
You are being treated for dental abscess with amoxicillin antibiotic.  You may use warm compresses to the affected area of your face for 15 minutes at a time 2-3 times daily.  You may take Tylenol for pain and headache due to you currently breast-feeding.  Please follow-up with dentist as soon as possible for further evaluation and management.

## 2020-12-21 ENCOUNTER — Encounter (HOSPITAL_COMMUNITY): Payer: Self-pay

## 2020-12-21 ENCOUNTER — Ambulatory Visit (HOSPITAL_COMMUNITY)
Admission: EM | Admit: 2020-12-21 | Discharge: 2020-12-21 | Disposition: A | Discharge status: Home / Self Care | Payer: Medicaid Other | Attending: Student | Admitting: Student

## 2020-12-21 ENCOUNTER — Other Ambulatory Visit: Payer: Self-pay

## 2020-12-21 DIAGNOSIS — Z1152 Encounter for screening for COVID-19: Secondary | ICD-10-CM | POA: Insufficient documentation

## 2020-12-21 DIAGNOSIS — J069 Acute upper respiratory infection, unspecified: Secondary | ICD-10-CM | POA: Insufficient documentation

## 2020-12-21 NOTE — ED Triage Notes (Signed)
Pt states on Tuesday she started having body aches, chills, fever.

## 2020-12-21 NOTE — ED Provider Notes (Signed)
[facility]    CSN: [id number] Arrival date & time: 12/21/20  1722      History   Chief Complaint Chief Complaint  Patient presents with   Fever    HPI Wendy Merritt is a 35 y.o. female presenting with generalized bodyaches, chills, loss of smell , x3 days. Medical history noncontributory.  Has not monitored her temperature at home.  Tylenol and ibuprofen providing some relief. Crampy abd pain. Denies shortness of breath, chest pain, cough, congestion, facial pain, teeth pain, headaches, sore throat, loss of taste/smell, swollen lymph nodes, ear pain, dizziness.   HPI  Past Medical History:  Diagnosis Date   Acid reflux    Anemia    Bladder rupture 07/03/2019   Supervision of high risk pregnancy, antepartum 03/28/2019    Nursing Staff Provider Office Location  [facility] Dating  15wk [place] Language  English Anatomy US  Nml Flu Vaccine  Declined-05/19/19 Genetic Screen  NIPS:   AFP:   First Screen:  Quad:   TDaP vaccine   Declined-05/19/19 Hgb A1C or  GTT 1hr normal 03/14/2019 Third trimester  Rhogam  n/a   LAB RESULTS  Feeding Plan Breast Blood Type O/Positive/-- (09/09 0000)  Contraception OCPs Antibody Negative (09/09 0000)    Toothache     Patient Active Problem List   Diagnosis Date Noted   Screening examination for STD (sexually transmitted disease) 08/04/2019   Postpartum care following cesarean delivery 08/04/2019   Contraception management 08/04/2019   History of cesarean section 03/28/2019   Anemia     Past Surgical History:  Procedure Laterality Date   CESAREAN SECTION     CESAREAN SECTION N/A 07/03/2019   Procedure: CESAREAN SECTION;  Surgeon: Reva Bores, MD;  Location: [facility];  Service: Obstetrics;  Laterality: N/A;    OB History     Gravida  6   Para  6   Term  6   Preterm      AB      Living  6      SAB      IAB      Ectopic      Multiple  0   Live Births  6            Home Medications    Prior to Admission  medications   Medication Sig Start Date End Date Taking? Authorizing Provider  ibuprofen (ADVIL) 800 MG tablet Take 1 tablet (800 mg total) by mouth every 8 (eight) hours as needed. 07/05/19   Jackelyn Poling, DO  norethindrone (MICRONOR) 0.35 MG tablet Take 1 tablet (0.35 mg total) by mouth daily. 08/04/19   Hermina Staggers, MD  Prenatal Vit-Fe Fumarate-FA (PRENATAL MULTIVITAMIN) TABS tablet Take 1 tablet by mouth daily at 12 noon.    [provider]  UNABLE TO FIND Take 10 mLs by mouth daily. Floradix Iron+Herbs    [provider]    Family History History reviewed. No pertinent family history.  Social History Social History   Tobacco Use   Smoking status: Never   Smokeless tobacco: Never  Vaping Use   Vaping Use: Never used  Substance Use Topics   Alcohol use: No   Drug use: No     Allergies   Flagyl [metronidazole]   Review of Systems Review of Systems  Constitutional:  Positive for chills and fatigue. Negative for appetite change and fever.  HENT:  Negative for congestion, ear pain, rhinorrhea, sinus pressure, sinus pain and sore throat.  Eyes:  Negative for redness and visual disturbance.  Respiratory:  Negative for cough, chest tightness, shortness of breath and wheezing.   Cardiovascular:  Negative for chest pain and palpitations.  Gastrointestinal:  Negative for abdominal pain, constipation, diarrhea, nausea and vomiting.  Genitourinary:  Negative for dysuria, frequency and urgency.  Musculoskeletal:  Negative for myalgias.  Neurological:  Negative for dizziness, weakness and headaches.  Psychiatric/Behavioral:  Negative for confusion.   All other systems reviewed and are negative.   Physical Exam Triage Vital Signs ED Triage Vitals  Enc Vitals Group     BP 12/21/20 1831 121/67     Pulse Rate 12/21/20 1831 100     Resp --      Temp 12/21/20 1831 98.8 F (37.1 C)     Temp Source 12/21/20 1831 Oral     SpO2 12/21/20 1831 97 %     Weight --       Height --      Head Circumference --      Peak Flow --      Pain Score 12/21/20 1828 7     Pain Loc --      Pain Edu? --      Excl. in GC? --    No data found.  Updated Vital Signs BP 121/67 (BP Location: Right Arm)   Pulse 100   Temp 98.8 F (37.1 C) (Oral)   LMP 12/11/2020   SpO2 97%   Visual Acuity Right Eye Distance:   Left Eye Distance:   Bilateral Distance:    Right Eye Near:   Left Eye Near:    Bilateral Near:     Physical Exam Vitals reviewed.  Constitutional:      General: She is not in acute distress.    Appearance: Normal appearance. She is ill-appearing.  HENT:     Head: Normocephalic and atraumatic.     Right Ear: Tympanic membrane, ear canal and external ear normal. No tenderness. No middle ear effusion. There is no impacted cerumen. Tympanic membrane is not perforated, erythematous, retracted or bulging.     Left Ear: Tympanic membrane, ear canal and external ear normal. No tenderness.  No middle ear effusion. There is no impacted cerumen. Tympanic membrane is not perforated, erythematous, retracted or bulging.     Nose: Nose normal. No congestion.     Mouth/Throat:     Mouth: Mucous membranes are moist.     Pharynx: Uvula midline. No oropharyngeal exudate or posterior oropharyngeal erythema.  Eyes:     Extraocular Movements: Extraocular movements intact.     Pupils: Pupils are equal, round, and reactive to light.  Cardiovascular:     Rate and Rhythm: Normal rate and regular rhythm.     Heart sounds: Normal heart sounds.  Pulmonary:     Effort: Pulmonary effort is normal.     Breath sounds: Normal breath sounds. No decreased breath sounds, wheezing, rhonchi or rales.  Abdominal:     Palpations: Abdomen is soft.     Tenderness: There is no abdominal tenderness. There is no guarding or rebound.  Neurological:     General: No focal deficit present.     Mental Status: She is alert and oriented to person, place, and time.  Psychiatric:         Mood and Affect: Mood normal.        Behavior: Behavior normal.        Thought Content: Thought content normal.  Judgment: Judgment normal.     UC Treatments / Results  Labs (all labs ordered are listed, but only abnormal results are displayed) Labs Reviewed  SARS CORONAVIRUS 2 (TAT 6-24 HRS)    EKG   Radiology No results found.  Procedures Procedures (including critical care time)  Medications Ordered in UC Medications - No data to display  Initial Impression / Assessment and Plan / UC Course  I have reviewed the triage vital signs and the nursing notes.  Pertinent labs & imaging results that were available during my care of the patient were reviewed by me and considered in my medical decision making (see chart for details).     This patient is a very pleasant 36 y.o. year old female presenting with viral illness. Today this pt is afebrile nontachycardic nontachypneic, oxygenating well on room air, no wheezes rhonchi or rales. She is breastfeeding.  Centor score 0, rapid strep deferred Given day 3 of symptoms,  rapid influenza deferred Covid PCR sent  Tylenol/ibuprofen  Work note provided. ED return precautions discussed. Patient verbalizes understanding and agreement.    Final Clinical Impressions(s) / UC Diagnoses   Final diagnoses:  Viral URI  Encounter for screening for COVID-19  Lactating mother     Discharge Instructions      -For fevers/chills, bodyaches, headaches- -You can take Tylenol up to 1000 mg 3 times daily, and ibuprofen up to 600 mg 3 times daily with food.  You can take these together, or alternate every 3-4 hours. -Get lots of rest and drink plenty of fluids -With a virus, you're typically contagious for 5-7 days, or as long as you're having fevers.        ED Prescriptions   None    PDMP not reviewed this encounter.   Rhys Martini, PA-C 12/21/20 1927

## 2020-12-21 NOTE — Discharge Instructions (Addendum)
-  For fevers/chills, bodyaches, headaches- -You can take Tylenol up to 1000 mg 3 times daily, and ibuprofen up to 600 mg 3 times daily with food.  You can take these together, or alternate every 3-4 hours. -Get lots of rest and drink plenty of fluids -With a virus, you're typically contagious for 5-7 days, or as long as you're having fevers.

## 2020-12-22 LAB — SARS CORONAVIRUS 2 (TAT 6-24 HRS): SARS Coronavirus 2: NEGATIVE

## 2021-03-08 DIAGNOSIS — F410 Panic disorder [episodic paroxysmal anxiety] without agoraphobia: Secondary | ICD-10-CM

## 2021-03-08 DIAGNOSIS — Z1330 Encounter for screening examination for mental health and behavioral disorders, unspecified: Secondary | ICD-10-CM

## 2021-03-08 NOTE — Progress Notes (Signed)
Report given to [facility] regarding [facility] record. Integrated Behavorial Health referral placed for follow up.

## 2021-03-11 NOTE — BH Specialist Note (Signed)
Pt did not arrive to video visit and did not answer the phone; unable to leave voicemail; left MyChart message for patient.   

## 2021-03-18 ENCOUNTER — Ambulatory Visit: Payer: Medicaid Other | Admitting: Clinical

## 2021-03-18 DIAGNOSIS — Z91199 Patient's noncompliance with other medical treatment and regimen due to unspecified reason: Secondary | ICD-10-CM

## 2021-03-27 DIAGNOSIS — Z1231 Encounter for screening mammogram for malignant neoplasm of breast: Secondary | ICD-10-CM
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
## 2021-04-17 ENCOUNTER — Ambulatory Visit (INDEPENDENT_AMBULATORY_CARE_PROVIDER_SITE_OTHER): Payer: Medicaid Other | Admitting: Clinical

## 2021-04-17 ENCOUNTER — Other Ambulatory Visit: Payer: Self-pay

## 2021-04-17 DIAGNOSIS — F419 Anxiety disorder, unspecified: Secondary | ICD-10-CM

## 2021-04-17 DIAGNOSIS — F322 Major depressive disorder, single episode, severe without psychotic features: Secondary | ICD-10-CM

## 2021-04-18 NOTE — Patient Instructions (Addendum)
Center for [facility] at [facility] [address] [place], [place] [address] [phone] (main office) [phone] (Mahlani Berninger's office)  24/7 Behavioral Health Crisis Center:  [facility]: [facility] (24/7): [phone]   [address] [place], [place] [address] - 24 Hour Crisis Line: 1 [phone]   Coping with Panic Attacks   What is a panic attack?  You may have had a panic attack if you experienced four or more of the symptoms listed below coming on abruptly and peaking in about 10 minutes.  Panic Symptoms    Pounding heart   Sweating   Trembling or shaking   Shortness of breath   Feeling of choking   Chest pain   Nausea or abdominal distress     Feeling dizzy, unsteady, lightheaded, or faint   Feelings of unreality or being detached from yourself   Fear of losing control or going crazy   Fear of dying   Numbness or tingling   Chills or hot flashes      Panic attacks are sometimes accompanied by avoidance of certain places or situations. These are often situations that would be difficult to escape from or in which help might not be available. Examples might include crowded shopping malls, public transportation, restaurants, or driving.   Why do panic attacks occur?   Panic attacks are the body's alarm system gone awry. All of [place] have a built-in alarm system, powered by adrenaline, which increases our heart rate, breathing, and blood flow in response to danger. Ordinarily, this 'danger response system' works well. In some people, however, the response is either out of proportion to whatever stress is going on, or may come out of the blue without any stress at all.   For example, if you are walking in the woods and see a bear coming your way, a variety of changes occur in your body to prepare you to either fight the danger or flee from the situation. Your heart rate will increase to get more blood flow around  your body, your breathing rate will quicken so that more oxygen is available, and your muscles will tighten in order to be ready to fight or run. You may feel nauseated as blood flow leaves your stomach area and moves into your limbs. These bodily changes are all essential to helping you survive the dangerous situation. After the danger has passed, your body functions will begin to go back to normal. This is because your body also has a system for "recovering" by bringing your body back down to a normal state when the danger is over.   As you can see, the emergency response system is adaptive when there is, in fact, a "true" or "real" danger (e.g., bear). However, sometimes people find that their emergency response system is triggered in "everyday" situations where there really is no true physical danger (e.g., in a meeting, in the grocery store, while driving in normal traffic, etc.).   What triggers a panic attack?  Sometimes particularly stressful situations can trigger a panic attack. For example, an argument with your spouse or stressors at work can cause a stress response (activating the emergency response system) because you perceive it as threatening or overwhelming, even if there is no direct risk to your survival.  Sometimes panic attacks don't seem to be triggered by anything in particular- they may "come out of the blue". Somehow, the natural "fight or flight" emergency response system has gotten activated  when there is no real danger. Why does the body go into "emergency mode" when there is no real danger?   Often, people with panic attacks are frightened or alarmed by the physical sensations of the emergency response system. First, unexpected physical sensations are experienced (tightness in your chest or some shortness of breath). This then leads to feeling fearful or alarmed by these symptoms ("Something's wrong!", "Am I having a heart attack?", "Am I going to faint?") The mind perceives  that there is a danger even though no real danger exists. This, in turn, activates the emergency response system ("fight or flight"), leading to a "full blown" panic attack. In summary, panic attacks occur when we misinterpret physical symptoms as signs of impending death, craziness, loss of control, embarrassment, or fear of fear. Sometimes you may be aware of thoughts of danger that activate the emergency response system (for example, thinking "I'm having a heart attack" when you feel chest pressure or increased heart rate). At other times, however, you may not be aware of such thoughts. After several incidences of being afraid of physical sensations, anxiety and panic can occur in response to the initial sensations without conscious thoughts of danger. Instead, you just feel afraid or alarmed. In other words, the panic or fear may seem to occur "automatically" without you consciously telling yourself anything.   After having had one or more panic attacks, you may also become more focused on what is going on inside your body. You may scan your body and be more vigilant about noticing any symptoms that might signal the start of a panic attack. This makes it easier for panic attacks to happen again because you pick up on sensations you might otherwise not have noticed, and misinterpret them as something dangerous. A panic attack may then result.      How do I cope with panic attacks?  An important part of overcoming panic attacks involves re-interpreting your body's physical reactions and teaching yourself ways to decrease the physical arousal. This can be done through practicing the cognitive and behavioral interventions below.   Research has found that over half of people who have panic attacks show some signs of hyperventilation or overbreathing. This can produce initial sensations that alarm you and lead to a panic attack. Overbreathing can also develop as part of the panic attack and make the symptoms  worse. When people hyperventilate, certain blood vessels in the body become narrower. In particular, the brain may get slightly less oxygen. This can lead to the symptoms of dizziness, confusion, and lightheadedness that often occur during panic attacks. Other parts of the body may also get a bit less oxygen, which may lead to numbness or tingling in the hands or feet or the sensation of cold, clammy hands. It also may lead the heart to pump harder. Although these symptoms may be frightening and feel unpleasant, it is important to remember that hyperventilating is not dangerous. However, you can help overcome the unpleasantness of overbreathing by practicing Breathing Retraining.   Practice this basic technique three times a day, every day:   Inhale. With your shoulders relaxed, inhale as slowly and deeply as you can while you count to six. If you can, use your diaphragm to fill your lungs with air.   Hold. Keep the air in your lungs as you slowly count to four.   Exhale. Slowly breath out as you count to six.   Repeat. Do the inhale-hold-exhale cycle several times. Each time you do  it, exhale for longer counts.  Like any new skill, Breathing Retraining requires practice. Try practicing this skill twice a day for several minutes. Initially, do not try this technique in specific situations or when you become frightened or have a panic attack. Begin by practicing in a quiet environment to build up your skill level so that you can later use it in time of "emergency."   2. Decreasing Avoidance  Regardless of whether you can identify why you began having panic attacks or whether they seemed to come out of the blue, the places where you began having panic attacks often can become triggers themselves. It is not uncommon for individuals to begin to avoid the places where they have had panic attacks. Over time, the individual may begin to avoid more and more places, thereby decreasing their activities and often  negatively impacting their quality of life. To break the cycle of avoidance, it is important to first identify the places or situations that are being avoided, and then to do some "relearning."  To begin this intervention, first create a list of locations or situations that you tend to avoid. Then choose an avoided location or situation that you would like to target first. Now develop an "exposure hierarchy" for this situation or location. An "exposure hierarchy" is a list of actions that make you feel anxious in this situation. Order these actions from least to most anxiety-producing. It is often helpful to have the first item on your hierarchy involve thinking or imagining part of the feared/avoided situation.   Here is an example of an exposure hierarchy for decreasing avoidance of the grocery store. Note how it is ordered from the least amount of anxiety (at the top) to the most anxiety (at the bottom):   Think about going to the grocery store alone.   Go to the grocery store with a friend or family member.   Go to the grocery store alone to pick up a few small items (5-10 minutes in the store).   Shopping for 10-20 minutes in the store alone.   Doing the shopping for the week by myself (20-30 minutes in the store).   Your homework is to "expose" yourself to the lowest item on your hierarchy and use your breathing relaxation and coping statements (see below) to help you remain in the situation. Practice this several times during the upcoming week. Once you have mastered each item with minimal anxiety, move on to the next higher action on your list.   Cognitive Interventions  Identify your negative self-talk Anxious thoughts can increase anxiety symptoms and panic. The first step in changing anxious thinking is to identify your own negative, alarming self-talk. Some common alarming thoughts:  I'm having a heart attack.            I must be going crazy. I think I'm dying. People will think I'm  crazy. I'm going to pass our.  Oh no- here it comes.  I can't stand this.  I've got to get out of here!  2. Use positive coping statements Changing or disrupting a pattern of anxious thoughts by replacing them with more calming or supportive statements can help to divert a panic attack. Some common helpful coping statements:  This is not an emergency.  I don't like feeling this way, but I can accept it.  I can feel like this and still be okay.  This has happened before, and I was okay. I'll be okay this time, too.  I can be anxious and still deal with this situation.   /Emotional Wellbeing Apps and Websites Here are a few free apps meant to help you to help yourself.  To find, try searching on the internet to see if the app is offered on Apple/Android devices. If your first choice doesn't come up on your device, the good news is that there are many choices! Play around with different apps to see which ones are helpful to you.    Calm This is an app meant to help increase calm feelings. Includes info, strategies, and tools for tracking your feelings.      Calm Harm  This app is meant to help with self-harm. Provides many 5-minute or 15-min coping strategies for doing instead of hurting yourself.       Healthy Minds Health Minds is a problem-solving tool to help deal with emotions and cope with stress you encounter wherever you are.      MindShift This app can help people cope with anxiety. Rather than trying to avoid anxiety, you can make an important shift and face it.      MY3  MY3 features a support system, safety plan and resources with the goal of offering a tool to use in a time of need.       My Life My Voice  This mood journal offers a simple solution for tracking your thoughts, feelings and moods. Animated emoticons can help identify your mood.       Relax Melodies Designed to help with sleep, on this app you can mix sounds and meditations for relaxation.       Smiling Mind Smiling Mind is meditation made easy: it's a simple tool that helps put a smile on your mind.        Stop, Breathe & Think  A friendly, simple guide for people through meditations for mindfulness and compassion.  Stop, Breathe and Think Kids Enter your current feelings and choose a "mission" to help you cope. Offers videos for certain moods instead of just sound recordings.       Team Orange The goal of this tool is to help teens change how they think, act, and react. This app helps you focus on your own good feelings and experiences.      The [employer] Box The [employer] Box (VHB) contains simple tools to help patients with coping, relaxation, distraction, and positive thinking.

## 2021-04-22 NOTE — BH Specialist Note (Signed)
Integrated Behavioral Health via Telemedicine Visit  04/22/2021 [name] [id number]  Number of Integrated Behavioral Health visits: 2 Session Start time: 3:56  Session End time: 4:49 Total time:  69  Referring Provider: Tinnie Gens, MD Patient/Family location: Home [facility] Provider location: Center for [facility] at [facility]  All persons participating in visit: Patient Wendy Merritt and [facility] Wendy Merritt   Types of Service: Individual psychotherapy and Video visit  I connected with Wendy Merritt and/or Wendy Merritt's  n/a  via  Telephone or Engineer, civil (consulting)  (Video is Caregility application) and verified that I am speaking with the correct person using two identifiers. Discussed confidentiality: Yes   I discussed the limitations of telemedicine and the availability of in person appointments.  Discussed there is a possibility of technology failure and discussed alternative modes of communication if that failure occurs.  I discussed that engaging in this telemedicine visit, they consent to the provision of behavioral healthcare and the services will be billed under their insurance.  Patient and/or legal guardian expressed understanding and consented to Telemedicine visit: Yes   Presenting Concerns: Patient and/or family reports the following symptoms/concerns: Continued ongoing depression and anxiety; helps to talk through feelings. Duration of problem: Increasing in past year; Severity of problem: severe  Patient and/or Family's Strengths/Protective Factors: Concrete supports in place (healthy food, safe environments, etc.) and Sense of purpose  Goals Addressed: Patient will:  Reduce symptoms of: anxiety, depression, and stress   Increase knowledge and/or ability of: coping skills   Demonstrate ability to: Increase healthy adjustment to current life circumstances  Progress towards  Goals: Ongoing  Interventions: Interventions utilized:  CBT Cognitive Behavioral Therapy Standardized Assessments completed:  Not given today  Patient and/or Family Response: Pt agrees with treatment plan  Assessment: Patient currently experiencing Major depressive disorder, single episode, severe, without psychotic features and Anxiety disorder, unspecified.   Patient may benefit from continued psychoeducation and brief therapeutic interventions regarding coping with symptoms of anxiety, depression, stress .  Plan: Follow up with behavioral health clinician on : Three weeks; Call Wendy Merritt at [phone], as needed Behavioral recommendations:  -Continue using relaxation breathing exercise as needed daily -Begin using "Invisible Shield/Cloak" strategy daily at work, as needed -Enjoy time with children over the holidays, as discussed -Discuss medication options at upcoming medical appointment on 05/28/20 Referral(s): Integrated [employer] (In Clinic)  I discussed the assessment and treatment plan with the patient and/or parent/guardian. They were provided an opportunity to ask questions and all were answered. They agreed with the plan and demonstrated an understanding of the instructions.   They were advised to call back or seek an in-person evaluation if the symptoms worsen or if the condition fails to improve as anticipated.  Wendy Lips, LCSW  Depression screen [facility] 2/9 04/17/2021 06/20/2019  Decreased Interest 3 0  Down, Depressed, Hopeless 3 0  PHQ - 2 Score 6 0  Altered sleeping 3 1  Tired, decreased energy 3 1  Change in appetite 3 0  Feeling bad or failure about yourself  2 0  Trouble concentrating 3 0  Moving slowly or fidgety/restless 3 0  Suicidal thoughts 1 0  PHQ-9 Score 24 2   GAD 7 : Generalized Anxiety Score 04/17/2021 06/20/2019  Nervous, Anxious, on Edge 3 0  Control/stop worrying 3 0  Worry too much - different things 3 0  Trouble relaxing 3  1  Restless 3 0  Easily annoyed or irritable 3  0  Afraid - awful might happen 3 0  Total GAD 7 Score 21 1

## 2021-05-01 ENCOUNTER — Ambulatory Visit: Payer: Medicaid Other | Admitting: Clinical

## 2021-05-01 DIAGNOSIS — F322 Major depressive disorder, single episode, severe without psychotic features: Secondary | ICD-10-CM

## 2021-05-01 DIAGNOSIS — F419 Anxiety disorder, unspecified: Secondary | ICD-10-CM

## 2021-05-01 NOTE — Patient Instructions (Signed)
[facility] at  [facility] [address] [place], [place] [address] [phone] (main office) [phone] (Mohsin Crum's office)   

## 2021-05-15 ENCOUNTER — Telehealth: Payer: Medicaid Other | Admitting: Certified Nurse Midwife

## 2021-05-15 ENCOUNTER — Ambulatory Visit: Payer: Medicaid Other | Admitting: Certified Nurse Midwife

## 2021-05-16 NOTE — BH Specialist Note (Signed)
Pt did not arrive to video visit and did not answer the phone; Left HIPPA-compliant message to call back Taison Celani from [facility] at [facility] at  [phone] (Leyli Kevorkian's office).  ?; left MyChart message for patient.  ? ?

## 2021-05-28 ENCOUNTER — Encounter: Payer: Self-pay | Admitting: Family Medicine

## 2021-05-28 ENCOUNTER — Telehealth (INDEPENDENT_AMBULATORY_CARE_PROVIDER_SITE_OTHER): Payer: Medicaid Other | Admitting: Family Medicine

## 2021-05-28 DIAGNOSIS — F32A Depression, unspecified: Secondary | ICD-10-CM

## 2021-05-28 DIAGNOSIS — Z566 Other physical and mental strain related to work: Secondary | ICD-10-CM | POA: Diagnosis not present

## 2021-05-28 DIAGNOSIS — F419 Anxiety disorder, unspecified: Secondary | ICD-10-CM

## 2021-05-28 MED ORDER — ESCITALOPRAM OXALATE 5 MG PO TABS: ORAL_TABLET | ORAL | 0 refills | Status: DC

## 2021-05-28 MED ORDER — HYDROXYZINE HCL 10 MG PO TABS: 10.0000 mg | ORAL_TABLET | Freq: Three times a day (TID) | ORAL | 0 refills | Status: DC | PRN

## 2021-05-28 NOTE — Progress Notes (Signed)
I connected with  [name] on 05/28/21 at  1:15 PM EST by MyChart Virtual Visit and verified that I am speaking with the correct person using two identifiers.   I discussed the limitations, risks, security and privacy concerns of performing an evaluation and management service by telephone and the availability of in person appointments. I also discussed with the patient that there may be a patient responsible charge related to this service. The patient expressed understanding and agreed to proceed.  Guy Begin, CMA 05/28/2021  1:16 PM
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
## 2021-05-30 ENCOUNTER — Ambulatory Visit: Payer: Medicaid Other | Admitting: Clinical

## 2021-05-30 DIAGNOSIS — Z91199 Patient's noncompliance with other medical treatment and regimen due to unspecified reason: Secondary | ICD-10-CM

## 2021-06-28 ENCOUNTER — Encounter: Payer: Medicaid Other | Admitting: Family Medicine

## 2021-06-28 ENCOUNTER — Telehealth: Payer: Self-pay

## 2021-06-28 DIAGNOSIS — F32A Depression, unspecified: Secondary | ICD-10-CM

## 2021-06-28 DIAGNOSIS — Z566 Other physical and mental strain related to work: Secondary | ICD-10-CM

## 2021-06-28 NOTE — Progress Notes (Signed)
Unable to reach patient for virtual visit after multiple attempts. Error encounter created.  This encounter was created in error - please disregard.

## 2021-06-28 NOTE — Telephone Encounter (Signed)
Attempted to call patient at approximately 0930 and 0945 to remind her of her 0915 virutal appointment with Dr. Mathis Fare on [address]. Unable to reach patient or leave voicemail as voicemail box stated it was full.   Alesia Richards, RN 06/28/21

## 2021-07-09 NOTE — BH Specialist Note (Deleted)
Integrated Behavioral Health via Telemedicine Visit ? ?07/09/2021 ?[name] ?[id number] ? ?Number of [place] Clinician visits: No data recorded ?Session Start time: No data recorded  ?Session End time: No data recorded ?Total time in minutes: No data recorded ? ?Referring Provider: *** ?Patient/Family location: *** ?[facility] Provider location: *** ?All persons participating in visit: *** ?Types of Service: {CHL AMB TYPE OF SERVICE:[phone]} ? ?I connected with Wendy Merritt and/or Wendy Merritt's {family members:20773} via  Telephone or Geologist, engineering  (Video is Tree surgeon) and verified that I am speaking with the correct person using two identifiers. Discussed confidentiality: {YES/NO:21197} ? ?I discussed the limitations of telemedicine and the availability of in person appointments.  Discussed there is a possibility of technology failure and discussed alternative modes of communication if that failure occurs. ? ?I discussed that engaging in this telemedicine visit, they consent to the provision of behavioral healthcare and the services will be billed under their insurance. ? ?Patient and/or legal guardian expressed understanding and consented to Telemedicine visit: {YES/NO:21197} ? ?Presenting Concerns: ?Patient and/or family reports the following symptoms/concerns: *** ?Duration of problem: ***; Severity of problem: {Mild/Moderate/Severe:20260} ? ?Patient and/or Family's Strengths/Protective Factors: ?{CHL AMB BH PROTECTIVE FACTORS:[phone]} ? ?Goals Addressed: ?Patient will: ? Reduce symptoms of: {IBH Symptoms:21014056}  ? Increase knowledge and/or ability of: {IBH Patient Tools:21014057}  ? Demonstrate ability to: {IBH Goals:21014053} ? ?Progress towards Goals: ?{CHL AMB BH PROGRESS TOWARDS [place]:7150587 ? ?Interventions: ?Interventions utilized:  {IBH Interventions:21014054} ?Standardized Assessments completed: {IBH Screening  Tools:21014051} ? ?Patient and/or Family Response: *** ? ?Assessment: ?Patient currently experiencing ***.  ? ?Patient may benefit from ***. ? ?Plan: ?Follow up with behavioral health clinician on : *** ?Behavioral recommendations: *** ?Referral(s): {IBH Referrals:21014055} ? ?I discussed the assessment and treatment plan with the patient and/or parent/guardian. They were provided an opportunity to ask questions and all were answered. They agreed with the plan and demonstrated an understanding of the instructions. ?  ?They were advised to call back or seek an in-person evaluation if the symptoms worsen or if the condition fails to improve as anticipated. ? ?Garlan Fair, LCSW ?
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
## 2021-07-22 ENCOUNTER — Ambulatory Visit (INDEPENDENT_AMBULATORY_CARE_PROVIDER_SITE_OTHER): Payer: Medicaid Other | Admitting: Clinical

## 2021-07-22 DIAGNOSIS — F419 Anxiety disorder, unspecified: Secondary | ICD-10-CM

## 2021-07-22 DIAGNOSIS — F322 Major depressive disorder, single episode, severe without psychotic features: Secondary | ICD-10-CM | POA: Diagnosis not present

## 2021-07-22 NOTE — Patient Instructions (Signed)
[facility] [address] [place], [place] [address] [phone] (main office) [phone] (Twain Stenseth's office)   
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
## 2021-08-06 ENCOUNTER — Ambulatory Visit (INDEPENDENT_AMBULATORY_CARE_PROVIDER_SITE_OTHER): Payer: Medicaid Other | Admitting: Clinical

## 2021-08-06 ENCOUNTER — Telehealth (INDEPENDENT_AMBULATORY_CARE_PROVIDER_SITE_OTHER): Payer: Medicaid Other | Admitting: Family Medicine

## 2021-08-06 ENCOUNTER — Encounter: Payer: Self-pay | Admitting: Family Medicine

## 2021-08-06 DIAGNOSIS — F322 Major depressive disorder, single episode, severe without psychotic features: Secondary | ICD-10-CM

## 2021-08-06 DIAGNOSIS — Z566 Other physical and mental strain related to work: Secondary | ICD-10-CM | POA: Diagnosis not present

## 2021-08-06 DIAGNOSIS — F419 Anxiety disorder, unspecified: Secondary | ICD-10-CM

## 2021-08-06 DIAGNOSIS — F32A Depression, unspecified: Secondary | ICD-10-CM | POA: Diagnosis not present

## 2021-08-06 MED ORDER — HYDROXYZINE HCL 10 MG PO TABS: 10.0000 mg | ORAL_TABLET | Freq: Three times a day (TID) | ORAL | 0 refills | Status: DC | PRN

## 2021-08-06 MED ORDER — ESCITALOPRAM OXALATE 10 MG PO TABS: 10.0000 mg | ORAL_TABLET | Freq: Every day | ORAL | 1 refills | Status: DC
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
## 2021-08-20 ENCOUNTER — Ambulatory Visit (INDEPENDENT_AMBULATORY_CARE_PROVIDER_SITE_OTHER): Payer: Medicaid Other | Admitting: Clinical

## 2021-08-20 DIAGNOSIS — F419 Anxiety disorder, unspecified: Secondary | ICD-10-CM

## 2021-08-20 DIAGNOSIS — F4310 Post-traumatic stress disorder, unspecified: Secondary | ICD-10-CM | POA: Diagnosis not present

## 2021-08-20 DIAGNOSIS — F322 Major depressive disorder, single episode, severe without psychotic features: Secondary | ICD-10-CM
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
## 2021-08-25 ENCOUNTER — Other Ambulatory Visit: Payer: Self-pay | Admitting: Family Medicine

## 2021-08-25 DIAGNOSIS — F419 Anxiety disorder, unspecified: Secondary | ICD-10-CM

## 2021-09-04 ENCOUNTER — Ambulatory Visit (INDEPENDENT_AMBULATORY_CARE_PROVIDER_SITE_OTHER): Payer: Medicaid Other | Admitting: Clinical

## 2021-09-04 DIAGNOSIS — F4310 Post-traumatic stress disorder, unspecified: Secondary | ICD-10-CM

## 2021-09-10 DIAGNOSIS — F419 Anxiety disorder, unspecified: Secondary | ICD-10-CM | POA: Insufficient documentation

## 2021-09-10 DIAGNOSIS — F32A Depression, unspecified: Secondary | ICD-10-CM | POA: Insufficient documentation

## 2021-09-16 ENCOUNTER — Telehealth (INDEPENDENT_AMBULATORY_CARE_PROVIDER_SITE_OTHER): Payer: Medicaid Other | Admitting: Family Medicine

## 2021-09-16 ENCOUNTER — Encounter: Payer: Self-pay | Admitting: Family Medicine

## 2021-09-16 DIAGNOSIS — F419 Anxiety disorder, unspecified: Secondary | ICD-10-CM | POA: Diagnosis not present

## 2021-09-16 DIAGNOSIS — F32A Depression, unspecified: Secondary | ICD-10-CM

## 2021-09-16 DIAGNOSIS — F4310 Post-traumatic stress disorder, unspecified: Secondary | ICD-10-CM

## 2021-09-16 NOTE — Progress Notes (Signed)
I connected with Wendy Merritt 09/16/21 at  9:35 AM EDT by: MyChart video and verified that I am speaking with the correct person using two identifiers.  Patient is located at her home and provider is located at [facility].   ?  ?I discussed the limitations, risks, security and privacy concerns of performing an evaluation and management service by MyChart video and the availability of in person appointments. I also discussed with the patient that there may be a patient responsible charge related to this service. By engaging in this virtual visit, you consent to the provision of healthcare.  Additionally, you authorize for your insurance to be billed for the services provided during this visit.  The patient expressed understanding and agreed to proceed. ? ?The following staff members participated in the virtual visit:  Maxwell Marion, RN.  ? ? ? ?OFFICE VISIT NOTE ? ?Subjective:  ?Wendy Merritt is a 36 y.o. female who presents via video telehealth visit to follow up her anxiety/depression and PTSD. ? ?Anxiety/Depression: ?PTSD: ?-Reports her mood has improved somewhat since increasing dose of Lexapro to 10 mg ?-Has been taking this dose for about 4 weeks  ?-Reports having some headaches and decreased libido ?-Wondering if this is related to her medication ?-Still has mood symptoms that are triggered by mention of work related issues ?-Had to attend a deposition recently and this caused a lot of anxiety ?-Continues to follow with behavior health and finds this helpful ?-Referred to Psychiatry last visit and has an appointment on 5/10 ?-Denies SI/HI or thoughts of self harm today ?-She has no other concerns ? ?The following portions of the patient's history were reviewed and updated as appropriate: allergies, current medications, past family history, past medical history, past social history, past surgical history and problem list.  ? ?Objective:  ?Vital signs not obtained due to telehealth visit.  ? ?General: No  acute distress, cooperative and pleasant in conversation.  ?Psych: Depressed mood and affect, normal thought content and logic, normal insight, no SI/HI.  ? ?PHQ-9 score: 18; denies SI or thoughts of harm. GAD-7 score: 17 ? ?Assessment and Plan:  ? ?1. Anxiety and depression ?2. PTSD (post-traumatic stress disorder) ?Stable, improving, though still not at treatment goal. Discussed that her headaches may certainly be related to medication titration. Reviewed that this is often a temporary side effect and reviewed ways to help with this. Decreased libido may be due to medication, but may also be due to persistent mood symptoms that are not yet well controlled. Will defer further medication titration or augmentation with alternative medication until patient meets with Psychiatry. No safety concerns today. Emphasized importance of on-going behavioral health support, particularly to help with PTSD symptoms. Will reassess in 1 month in person for annual exam and follow up mood at that time.  ? ?Return in about 4 weeks (around 10/14/2021) for annual exam with pap smear and mood check. ? ?Future Appointments  ?Date Time Provider Department Center  ?09/18/2021 11:00 AM Thresa Ross, MD [facility] None  ?10/29/2021  3:15 PM Worthy Rancher, MD [facility]  ? ?Time spent on virtual visit: 18 minutes ? ?Worthy Rancher, MD ? ?

## 2021-09-16 NOTE — Progress Notes (Signed)
I connected with  Wendy Merritt on 09/16/21 at 1004 by telephone and verified that I am speaking with the correct person using two identifiers. ?  ?I discussed the limitations, risks, security and privacy concerns of performing an evaluation and management service by telephone and the availability of in person appointments. I also discussed with the patient that there may be a patient responsible charge related to this service. The patient expressed understanding and agreed to proceed. ? ?PHQ-9 score: 18; denies SI or thoughts of harm. GAD-7 score: 17. ? ?Marjo Bicker, RN ?09/16/2021  10:03 AM ? ?

## 2021-09-18 ENCOUNTER — Telehealth (HOSPITAL_COMMUNITY): Payer: Medicaid Other | Admitting: Psychiatry

## 2021-10-04 ENCOUNTER — Encounter (HOSPITAL_COMMUNITY): Payer: Self-pay | Admitting: Psychiatry

## 2021-10-04 ENCOUNTER — Telehealth (INDEPENDENT_AMBULATORY_CARE_PROVIDER_SITE_OTHER): Payer: Medicaid Other | Admitting: Psychiatry

## 2021-10-04 DIAGNOSIS — F32A Depression, unspecified: Secondary | ICD-10-CM

## 2021-10-04 DIAGNOSIS — F419 Anxiety disorder, unspecified: Secondary | ICD-10-CM | POA: Diagnosis not present

## 2021-10-04 DIAGNOSIS — F411 Generalized anxiety disorder: Secondary | ICD-10-CM

## 2021-10-04 DIAGNOSIS — F331 Major depressive disorder, recurrent, moderate: Secondary | ICD-10-CM

## 2021-10-04 DIAGNOSIS — F4310 Post-traumatic stress disorder, unspecified: Secondary | ICD-10-CM | POA: Diagnosis not present

## 2021-10-04 MED ORDER — ESCITALOPRAM OXALATE 10 MG PO TABS: 15.0000 mg | ORAL_TABLET | Freq: Every day | ORAL | 0 refills | Status: DC
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
## 2021-10-29 ENCOUNTER — Other Ambulatory Visit (HOSPITAL_COMMUNITY)
Admission: RE | Admit: 2021-10-29 | Discharge: 2021-10-29 | Disposition: A | Discharge status: Home / Self Care | Payer: Medicaid Other | Source: Ambulatory Visit | Attending: Family Medicine | Admitting: Family Medicine

## 2021-10-29 ENCOUNTER — Ambulatory Visit (INDEPENDENT_AMBULATORY_CARE_PROVIDER_SITE_OTHER): Payer: Medicaid Other | Admitting: Family Medicine

## 2021-10-29 ENCOUNTER — Encounter: Payer: Self-pay | Admitting: Family Medicine

## 2021-10-29 ENCOUNTER — Other Ambulatory Visit: Payer: Self-pay

## 2021-10-29 VITALS — BP 122/68 | HR 93 | Wt 207.8 lb

## 2021-10-29 DIAGNOSIS — F4310 Post-traumatic stress disorder, unspecified: Secondary | ICD-10-CM

## 2021-10-29 DIAGNOSIS — F419 Anxiety disorder, unspecified: Secondary | ICD-10-CM | POA: Diagnosis not present

## 2021-10-29 DIAGNOSIS — F32A Depression, unspecified: Secondary | ICD-10-CM

## 2021-10-29 DIAGNOSIS — R519 Headache, unspecified: Secondary | ICD-10-CM | POA: Diagnosis not present

## 2021-10-29 DIAGNOSIS — Z01419 Encounter for gynecological examination (general) (routine) without abnormal findings: Secondary | ICD-10-CM

## 2021-10-29 DIAGNOSIS — G8929 Other chronic pain: Secondary | ICD-10-CM

## 2021-10-29 NOTE — Progress Notes (Unsigned)
GYNECOLOGY OFFICE VISIT NOTE  History:   Wendy Merritt is a 36 y.o. [phone] here today for an annual exam.   She denies any pelvic pain, dysuria, or abnormal vaginal discharge. She is currently on her period. She would like STI screening today. Her last pap smear was done in her last pregnancy in 2020. She believes this was normal.   Anxiety/Depression: PTSD: Chronic issue Previously started on Lexapro for this and referred to Psychiatry after last visit Admits that she is not taking her medication right now  Has a hard time getting herself to take it regularly  Symptoms are not well controlled Feels like she just does not have the energy to do the things she wants to do  She is tired a lot, also feels like she has gained weight  She wants to do things to help herself feel better but struggles to find the motivation  She was recommended to do therapy at the new psychiatry office She has not yet scheduled an appointment for this but thinks she will try to do this today   Headaches: Going on for the last 3 years  Taking BC powder to help with this  Ends up taking about 3 of these per day for her symptoms  Knows this is bad for her but has just gotten into the habit of doing it without fully realizing it  Headaches are mainly frontal  Have not changed in nature, get better with the medication She is wondering what else she can do to help with this   Past Medical History:  Diagnosis Date   Acid reflux    Anemia    Bladder rupture 07/03/2019   Supervision of high risk pregnancy, antepartum 03/28/2019    Nursing Staff Provider Office Location  [facility] Dating  15wk [place] Language  English Anatomy US  Nml Flu Vaccine  Declined-05/19/19 Genetic Screen  NIPS:   AFP:   First Screen:  Quad:   TDaP vaccine   Declined-05/19/19 Hgb A1C or  GTT 1hr normal 03/14/2019 Third trimester  Rhogam  n/a   LAB RESULTS  Feeding Plan Breast Blood Type O/Positive/-- (09/09 0000)  Contraception OCPs Antibody Negative  (09/09 0000)    Toothache     Past Surgical History:  Procedure Laterality Date   CESAREAN SECTION     CESAREAN SECTION N/A 07/03/2019   Procedure: CESAREAN SECTION;  Surgeon: Reva Bores, MD;  Location: [facility] LD [facility]S;  Service: Obstetrics;  Laterality: N/A;   The following portions of the patient's history were reviewed and updated as appropriate: allergies, current medications, past family history, past medical history, past social history, past surgical history and problem list.   Review of Systems:  Pertinent items noted in HPI and remainder of comprehensive ROS otherwise negative.  Physical Exam:  BP 122/68   Pulse 93   Wt 207 lb 12.8 oz (94.3 kg)   LMP 10/27/2021   BMI 36.81 kg/m   Chaperone present for exam.   CONSTITUTIONAL: Well-developed, well-nourished female in no acute distress.  HEENT:  Normocephalic, atraumatic. EOMI, conjunctivae clear. CARDIOVASCULAR: Normal heart rate noted. RESPIRATORY: Effort normal.  PELVIC: Normal external genitalia, normal vaginal mucosa and cervix, no adnexal tenderness or abdominal masses palpated.  SKIN: No rashes or lesions noted. MUSCULOSKELETAL: Normal range of motion. No LE edema noted. NEUROLOGIC: Alert and oriented to person, place, and time. No focal deficit noted.  PSYCHIATRIC: Depressed mood and affect, normal behavior, normal thought content and logic, normal insight.  Assessment and Plan:   1. Women's annual routine gynecological examination Pap smear obtained today in addition to STI screening. Normal exam as above. Will follow up results.  - Cytology - PAP( [place]) - HIV antibody (with reflex); Future - RPR; Future - Hepatitis C Antibody; Future  2. Anxiety and depression 3. PTSD (post-traumatic stress disorder) Chronic, uncontrolled. Previously initiated on Lexapro and referred to Psychiatry after last visit. Advised to increase dose to 15 mg daily, but patient admits to not taking medication as prescribed at  this time. Tearful in office. Provided support and encouragement. Discussed that her symptoms of anxiety and depression can sometimes make it difficult to adhere to her regimen/routine. Emphasized that taking her medication daily (regularly) is the only way to determine whether it will help or not. Also discussed that initiating regular therapy will also be immensely helpful to her in allowing her to better cope with her symptoms and continue to function/complete daily tasks. No safety concerns at this time. Patient is agreeable to restarting her medication and plans to call to set up her therapy visits today. Will reassess in 3-4 weeks.   4. Chronic nonintractable headache, unspecified headache type Chronic, uncontrolled. Likely rebound headaches in the setting of daily BC powder use TID. Recommended that patient stop taking BC powders for her symptoms. Recommended alternative treatments (rest, fluid intake, sleep, etc) to cope with symptoms. Can try Tylenol or Ibuprofen instead if severe, but encouraged patient to avoid medications if able to allow her body to reset from rebound cycle. May consider Neurology referral in the future if symptoms are not improved with these changes. Will reassess next visit.  Routine preventative health maintenance measures emphasized.  Please refer to After Visit Summary for other counseling recommendations.   Return in about 1 year (around 10/30/2022) for next annual exam, sooner as needed.  Evalina Field, MD OB Fellow, Faculty Practice [facility], [facility]

## 2021-10-30 ENCOUNTER — Other Ambulatory Visit: Payer: Self-pay

## 2021-10-30 ENCOUNTER — Encounter: Payer: Self-pay | Admitting: Family Medicine

## 2021-10-30 ENCOUNTER — Other Ambulatory Visit: Payer: Medicaid Other

## 2021-10-30 ENCOUNTER — Other Ambulatory Visit (HOSPITAL_COMMUNITY): Payer: Self-pay | Admitting: Psychiatry

## 2021-10-30 DIAGNOSIS — F32A Depression, unspecified: Secondary | ICD-10-CM

## 2021-10-30 DIAGNOSIS — F419 Anxiety disorder, unspecified: Secondary | ICD-10-CM

## 2021-10-30 DIAGNOSIS — Z01419 Encounter for gynecological examination (general) (routine) without abnormal findings: Secondary | ICD-10-CM

## 2021-10-30 LAB — CYTOLOGY - PAP
Chlamydia: NEGATIVE
Comment: NEGATIVE
Comment: NEGATIVE
Comment: NEGATIVE
Comment: NORMAL
Diagnosis: NEGATIVE
High risk HPV: NEGATIVE
Neisseria Gonorrhea: NEGATIVE
Trichomonas: NEGATIVE

## 2021-10-31 ENCOUNTER — Other Ambulatory Visit (HOSPITAL_COMMUNITY): Payer: Self-pay | Admitting: Psychiatry

## 2021-10-31 DIAGNOSIS — F32A Depression, unspecified: Secondary | ICD-10-CM

## 2021-10-31 LAB — RPR: RPR Ser Ql: NONREACTIVE

## 2021-10-31 LAB — HEPATITIS C ANTIBODY: Hep C Virus Ab: NONREACTIVE

## 2021-10-31 LAB — HIV ANTIBODY (ROUTINE TESTING W REFLEX): HIV Screen 4th Generation wRfx: NONREACTIVE

## 2021-11-05 ENCOUNTER — Other Ambulatory Visit (HOSPITAL_BASED_OUTPATIENT_CLINIC_OR_DEPARTMENT_OTHER): Payer: Self-pay | Admitting: Family Medicine

## 2021-11-05 DIAGNOSIS — Z1231 Encounter for screening mammogram for malignant neoplasm of breast: Secondary | ICD-10-CM

## 2021-11-06 ENCOUNTER — Telehealth (INDEPENDENT_AMBULATORY_CARE_PROVIDER_SITE_OTHER): Payer: Medicaid Other | Admitting: Psychiatry

## 2021-11-06 ENCOUNTER — Encounter (HOSPITAL_COMMUNITY): Payer: Self-pay | Admitting: Psychiatry

## 2021-11-06 DIAGNOSIS — F411 Generalized anxiety disorder: Secondary | ICD-10-CM | POA: Diagnosis not present

## 2021-11-06 DIAGNOSIS — F331 Major depressive disorder, recurrent, moderate: Secondary | ICD-10-CM | POA: Diagnosis not present

## 2021-11-06 DIAGNOSIS — F419 Anxiety disorder, unspecified: Secondary | ICD-10-CM | POA: Diagnosis not present

## 2021-11-06 DIAGNOSIS — F4310 Post-traumatic stress disorder, unspecified: Secondary | ICD-10-CM

## 2021-11-06 DIAGNOSIS — F32A Depression, unspecified: Secondary | ICD-10-CM

## 2021-11-06 MED ORDER — ESCITALOPRAM OXALATE 20 MG PO TABS: ORAL_TABLET | ORAL | 0 refills | Status: DC
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
## 2021-11-07 DIAGNOSIS — Z1231 Encounter for screening mammogram for malignant neoplasm of breast: Secondary | ICD-10-CM

## 2021-12-04 ENCOUNTER — Other Ambulatory Visit (HOSPITAL_COMMUNITY): Payer: Self-pay | Admitting: Psychiatry

## 2021-12-04 DIAGNOSIS — F32A Depression, unspecified: Secondary | ICD-10-CM

## 2021-12-09 ENCOUNTER — Telehealth (INDEPENDENT_AMBULATORY_CARE_PROVIDER_SITE_OTHER): Payer: Medicaid Other | Admitting: Psychiatry

## 2021-12-09 ENCOUNTER — Encounter (HOSPITAL_COMMUNITY): Payer: Self-pay | Admitting: Psychiatry

## 2021-12-09 DIAGNOSIS — F411 Generalized anxiety disorder: Secondary | ICD-10-CM

## 2021-12-09 DIAGNOSIS — F419 Anxiety disorder, unspecified: Secondary | ICD-10-CM

## 2021-12-09 DIAGNOSIS — F32A Depression, unspecified: Secondary | ICD-10-CM

## 2021-12-09 DIAGNOSIS — F331 Major depressive disorder, recurrent, moderate: Secondary | ICD-10-CM | POA: Diagnosis not present

## 2021-12-09 DIAGNOSIS — F4310 Post-traumatic stress disorder, unspecified: Secondary | ICD-10-CM

## 2021-12-09 MED ORDER — ESCITALOPRAM OXALATE 20 MG PO TABS: ORAL_TABLET | ORAL | 1 refills | Status: DC
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
## 2021-12-18 ENCOUNTER — Other Ambulatory Visit (HOSPITAL_COMMUNITY): Payer: Self-pay

## 2021-12-18 DIAGNOSIS — F419 Anxiety disorder, unspecified: Secondary | ICD-10-CM

## 2021-12-18 MED ORDER — ESCITALOPRAM OXALATE 20 MG PO TABS: ORAL_TABLET | ORAL | 0 refills | Status: DC

## 2021-12-31 ENCOUNTER — Ambulatory Visit (HOSPITAL_COMMUNITY): Payer: Medicaid Other | Admitting: Licensed Clinical Social Worker

## 2021-12-31 NOTE — Progress Notes (Signed)
Patient scheduled for an in person session and she did not show. Session is a no show

## 2022-02-12 ENCOUNTER — Encounter (HOSPITAL_COMMUNITY): Payer: Self-pay | Admitting: Psychiatry

## 2022-02-12 ENCOUNTER — Telehealth (INDEPENDENT_AMBULATORY_CARE_PROVIDER_SITE_OTHER): Payer: Medicaid Other | Admitting: Psychiatry

## 2022-02-12 DIAGNOSIS — F419 Anxiety disorder, unspecified: Secondary | ICD-10-CM | POA: Diagnosis not present

## 2022-02-12 DIAGNOSIS — F4310 Post-traumatic stress disorder, unspecified: Secondary | ICD-10-CM | POA: Diagnosis not present

## 2022-02-12 DIAGNOSIS — F331 Major depressive disorder, recurrent, moderate: Secondary | ICD-10-CM

## 2022-02-12 DIAGNOSIS — F411 Generalized anxiety disorder: Secondary | ICD-10-CM

## 2022-02-12 MED ORDER — ESCITALOPRAM OXALATE 20 MG PO TABS: ORAL_TABLET | ORAL | 0 refills | Status: DC
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
## 2022-03-18 ENCOUNTER — Encounter (HOSPITAL_COMMUNITY): Payer: Self-pay

## 2022-03-18 ENCOUNTER — Telehealth (HOSPITAL_COMMUNITY): Payer: Medicaid Other | Admitting: Psychiatry

## 2022-03-27 ENCOUNTER — Ambulatory Visit (INDEPENDENT_AMBULATORY_CARE_PROVIDER_SITE_OTHER): Payer: Medicaid Other | Admitting: Psychiatry

## 2022-03-27 ENCOUNTER — Encounter (HOSPITAL_COMMUNITY): Payer: Self-pay | Admitting: Psychiatry

## 2022-03-27 DIAGNOSIS — F4310 Post-traumatic stress disorder, unspecified: Secondary | ICD-10-CM

## 2022-03-27 DIAGNOSIS — F411 Generalized anxiety disorder: Secondary | ICD-10-CM

## 2022-03-27 DIAGNOSIS — F331 Major depressive disorder, recurrent, moderate: Secondary | ICD-10-CM | POA: Diagnosis not present

## 2022-03-27 MED ORDER — BUPROPION HCL ER (SR) 100 MG PO TB12: 100.0000 mg | ORAL_TABLET | Freq: Every day | ORAL | 0 refills | Status: DC
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
## 2022-04-14 ENCOUNTER — Telehealth (HOSPITAL_COMMUNITY): Payer: Self-pay | Admitting: *Deleted

## 2022-04-14 ENCOUNTER — Ambulatory Visit (INDEPENDENT_AMBULATORY_CARE_PROVIDER_SITE_OTHER): Payer: Medicaid Other | Admitting: Licensed Clinical Social Worker

## 2022-04-14 DIAGNOSIS — F332 Major depressive disorder, recurrent severe without psychotic features: Secondary | ICD-10-CM | POA: Diagnosis not present

## 2022-04-14 DIAGNOSIS — F411 Generalized anxiety disorder: Secondary | ICD-10-CM | POA: Diagnosis not present

## 2022-04-14 DIAGNOSIS — F4310 Post-traumatic stress disorder, unspecified: Secondary | ICD-10-CM

## 2022-04-14 NOTE — Telephone Encounter (Addendum)
90 DAY PRESCRIPTION REQUEST   buPROPion ER (WELLBUTRIN SR) 100 MG 12 hr tablet [id number] - [place], [place] - [address]   LAST APPT:03/17/22 NEXT APPT:04/14/22
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
## 2022-05-01 ENCOUNTER — Ambulatory Visit (HOSPITAL_COMMUNITY): Payer: Medicaid Other | Admitting: Licensed Clinical Social Worker

## 2022-05-01 ENCOUNTER — Encounter (HOSPITAL_COMMUNITY): Payer: Self-pay

## 2022-05-01 NOTE — Progress Notes (Signed)
Therapist contacted patient through text and she did not respond 

## 2022-05-13 ENCOUNTER — Ambulatory Visit (HOSPITAL_COMMUNITY): Payer: Self-pay | Admitting: Psychiatry

## 2022-05-14 ENCOUNTER — Ambulatory Visit (INDEPENDENT_AMBULATORY_CARE_PROVIDER_SITE_OTHER): Payer: Self-pay | Admitting: Licensed Clinical Social Worker

## 2022-05-14 ENCOUNTER — Encounter (HOSPITAL_COMMUNITY): Payer: Self-pay

## 2022-05-14 DIAGNOSIS — F4310 Post-traumatic stress disorder, unspecified: Secondary | ICD-10-CM

## 2022-05-14 DIAGNOSIS — F411 Generalized anxiety disorder: Secondary | ICD-10-CM

## 2022-05-14 DIAGNOSIS — F332 Major depressive disorder, recurrent severe without psychotic features: Secondary | ICD-10-CM
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
## 2022-05-16 ENCOUNTER — Encounter (HOSPITAL_COMMUNITY): Payer: Self-pay | Admitting: Psychiatry

## 2022-05-16 ENCOUNTER — Telehealth (INDEPENDENT_AMBULATORY_CARE_PROVIDER_SITE_OTHER): Payer: Self-pay | Admitting: Psychiatry

## 2022-05-16 DIAGNOSIS — F4310 Post-traumatic stress disorder, unspecified: Secondary | ICD-10-CM

## 2022-05-16 DIAGNOSIS — F32A Depression, unspecified: Secondary | ICD-10-CM

## 2022-05-16 DIAGNOSIS — F419 Anxiety disorder, unspecified: Secondary | ICD-10-CM

## 2022-05-16 DIAGNOSIS — F332 Major depressive disorder, recurrent severe without psychotic features: Secondary | ICD-10-CM

## 2022-05-16 DIAGNOSIS — F411 Generalized anxiety disorder: Secondary | ICD-10-CM

## 2022-05-16 MED ORDER — ESCITALOPRAM OXALATE 20 MG PO TABS: ORAL_TABLET | ORAL | 1 refills | Status: DC

## 2022-05-16 MED ORDER — HYDROXYZINE HCL 10 MG PO TABS: 10.0000 mg | ORAL_TABLET | Freq: Every day | ORAL | 0 refills | Status: DC | PRN

## 2022-05-16 MED ORDER — BUPROPION HCL ER (SR) 100 MG PO TB12: 100.0000 mg | ORAL_TABLET | Freq: Every day | ORAL | 1 refills | Status: DC
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
## 2022-05-26 ENCOUNTER — Ambulatory Visit (INDEPENDENT_AMBULATORY_CARE_PROVIDER_SITE_OTHER): Payer: Self-pay | Admitting: Licensed Clinical Social Worker

## 2022-05-26 DIAGNOSIS — F4310 Post-traumatic stress disorder, unspecified: Secondary | ICD-10-CM

## 2022-05-26 DIAGNOSIS — F411 Generalized anxiety disorder: Secondary | ICD-10-CM

## 2022-05-26 DIAGNOSIS — F32A Depression, unspecified: Secondary | ICD-10-CM

## 2022-05-26 DIAGNOSIS — F419 Anxiety disorder, unspecified: Secondary | ICD-10-CM

## 2022-05-26 DIAGNOSIS — F332 Major depressive disorder, recurrent severe without psychotic features: Secondary | ICD-10-CM

## 2022-05-26 NOTE — Progress Notes (Signed)
Patient did not show for session 

## 2022-06-02 ENCOUNTER — Other Ambulatory Visit (HOSPITAL_COMMUNITY): Payer: Self-pay

## 2022-06-02 DIAGNOSIS — F32A Depression, unspecified: Secondary | ICD-10-CM

## 2022-06-02 MED ORDER — HYDROXYZINE HCL 10 MG PO TABS: 10.0000 mg | ORAL_TABLET | Freq: Every day | ORAL | 0 refills | Status: DC | PRN

## 2022-06-10 ENCOUNTER — Encounter (HOSPITAL_COMMUNITY): Payer: Self-pay | Admitting: Licensed Clinical Social Worker

## 2022-06-10 ENCOUNTER — Ambulatory Visit (HOSPITAL_COMMUNITY): Payer: Medicaid Other | Admitting: Licensed Clinical Social Worker

## 2022-06-10 NOTE — Progress Notes (Signed)
Patient did not show for her appointment.

## 2022-06-20 ENCOUNTER — Telehealth (HOSPITAL_COMMUNITY): Payer: Self-pay | Admitting: Psychiatry

## 2022-06-21 ENCOUNTER — Ambulatory Visit: Admission: EM | Admit: 2022-06-21 | Discharge: 2022-06-21 | Discharge status: Against Medical Advice | Payer: Self-pay

## 2022-06-21 ENCOUNTER — Other Ambulatory Visit: Payer: Self-pay

## 2022-06-26 ENCOUNTER — Ambulatory Visit (HOSPITAL_COMMUNITY): Payer: Self-pay | Admitting: Licensed Clinical Social Worker

## 2022-07-07 ENCOUNTER — Ambulatory Visit (HOSPITAL_COMMUNITY): Payer: Self-pay | Admitting: Licensed Clinical Social Worker

## 2023-05-26 ENCOUNTER — Encounter (HOSPITAL_COMMUNITY): Payer: Self-pay | Admitting: Psychiatry

## 2023-05-26 ENCOUNTER — Ambulatory Visit (HOSPITAL_COMMUNITY): Payer: 59 | Admitting: Psychiatry

## 2023-05-26 VITALS — BP 123/90 | HR 93 | Ht 63.0 in | Wt 208.0 lb

## 2023-05-26 DIAGNOSIS — F411 Generalized anxiety disorder: Secondary | ICD-10-CM | POA: Diagnosis not present

## 2023-05-26 DIAGNOSIS — F332 Major depressive disorder, recurrent severe without psychotic features: Secondary | ICD-10-CM

## 2023-05-26 DIAGNOSIS — F32A Depression, unspecified: Secondary | ICD-10-CM

## 2023-05-26 MED ORDER — HYDROXYZINE HCL 10 MG PO TABS: 10.0000 mg | ORAL_TABLET | Freq: Every day | ORAL | 0 refills | Status: DC | PRN

## 2023-05-26 MED ORDER — BUPROPION HCL ER (SR) 100 MG PO TB12: 100.0000 mg | ORAL_TABLET | Freq: Every day | ORAL | 1 refills | Status: DC

## 2023-05-26 MED ORDER — ESCITALOPRAM OXALATE 10 MG PO TABS: ORAL_TABLET | ORAL | 0 refills | Status: DC
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
## 2023-06-19 ENCOUNTER — Other Ambulatory Visit (HOSPITAL_COMMUNITY): Payer: Self-pay | Admitting: Psychiatry

## 2023-06-19 DIAGNOSIS — F419 Anxiety disorder, unspecified: Secondary | ICD-10-CM

## 2023-06-23 ENCOUNTER — Ambulatory Visit (INDEPENDENT_AMBULATORY_CARE_PROVIDER_SITE_OTHER): Payer: Self-pay | Admitting: Licensed Clinical Social Worker

## 2023-06-23 DIAGNOSIS — F419 Anxiety disorder, unspecified: Secondary | ICD-10-CM | POA: Diagnosis not present

## 2023-06-23 DIAGNOSIS — F32A Depression, unspecified: Secondary | ICD-10-CM

## 2023-06-23 DIAGNOSIS — F439 Reaction to severe stress, unspecified: Secondary | ICD-10-CM

## 2023-06-23 DIAGNOSIS — F332 Major depressive disorder, recurrent severe without psychotic features: Secondary | ICD-10-CM

## 2023-06-23 DIAGNOSIS — F411 Generalized anxiety disorder: Secondary | ICD-10-CM
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
## 2023-06-30 ENCOUNTER — Ambulatory Visit (HOSPITAL_COMMUNITY): Payer: Self-pay | Admitting: Psychiatry

## 2023-07-06 ENCOUNTER — Other Ambulatory Visit (HOSPITAL_COMMUNITY): Payer: Self-pay | Admitting: *Deleted

## 2023-07-06 ENCOUNTER — Telehealth (HOSPITAL_COMMUNITY): Payer: Self-pay | Admitting: *Deleted

## 2023-07-06 DIAGNOSIS — F32A Depression, unspecified: Secondary | ICD-10-CM

## 2023-07-06 MED ORDER — ESCITALOPRAM OXALATE 10 MG PO TABS: 10.0000 mg | ORAL_TABLET | Freq: Every day | ORAL | 0 refills | Status: DC

## 2023-07-06 NOTE — Telephone Encounter (Signed)
 make appointment within a month.

## 2023-07-06 NOTE — Telephone Encounter (Signed)
 PROVIDER AUTHORIZED REFILL  --- CO SIGN escitalopram (LEXAPRO) 10 MG tablet

## 2023-07-06 NOTE — Telephone Encounter (Signed)
 90 DAY REFILL REQUEST  [id number] - [place], [place] - [address]   escitalopram (LEXAPRO) 10 MG tablet   NEXT APPT   NOT SCHEDULED LAST APPT  05/26/23

## 2023-07-08 ENCOUNTER — Telehealth (HOSPITAL_COMMUNITY): Payer: Self-pay | Admitting: *Deleted

## 2023-07-08 NOTE — Telephone Encounter (Signed)
 OPENED CHECKING  ON REFILL  07/05/22 ESCITALOPRAM 10 MG TABLET VERIFIED---[id number] - [place], [place] - [address]

## 2023-07-14 ENCOUNTER — Encounter (HOSPITAL_COMMUNITY): Payer: Self-pay

## 2023-07-14 ENCOUNTER — Ambulatory Visit (INDEPENDENT_AMBULATORY_CARE_PROVIDER_SITE_OTHER): Payer: Self-pay | Admitting: Licensed Clinical Social Worker

## 2023-07-14 DIAGNOSIS — F439 Reaction to severe stress, unspecified: Secondary | ICD-10-CM

## 2023-07-14 DIAGNOSIS — F332 Major depressive disorder, recurrent severe without psychotic features: Secondary | ICD-10-CM

## 2023-07-14 DIAGNOSIS — F411 Generalized anxiety disorder: Secondary | ICD-10-CM

## 2023-07-14 DIAGNOSIS — F419 Anxiety disorder, unspecified: Secondary | ICD-10-CM
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
## 2023-07-29 ENCOUNTER — Ambulatory Visit (INDEPENDENT_AMBULATORY_CARE_PROVIDER_SITE_OTHER): Payer: Self-pay | Admitting: Licensed Clinical Social Worker

## 2023-07-29 ENCOUNTER — Encounter (HOSPITAL_COMMUNITY): Payer: Self-pay

## 2023-07-29 DIAGNOSIS — Z91199 Patient's noncompliance with other medical treatment and regimen due to unspecified reason: Secondary | ICD-10-CM

## 2023-07-29 NOTE — Progress Notes (Signed)
 Patient has sick child late cancel

## 2023-08-04 ENCOUNTER — Encounter (HOSPITAL_COMMUNITY): Payer: Self-pay | Admitting: Psychiatry

## 2023-08-04 ENCOUNTER — Ambulatory Visit (INDEPENDENT_AMBULATORY_CARE_PROVIDER_SITE_OTHER): Payer: Self-pay | Admitting: Psychiatry

## 2023-08-04 VITALS — BP 125/83 | HR 88 | Ht 63.0 in | Wt 213.0 lb

## 2023-08-04 DIAGNOSIS — F4310 Post-traumatic stress disorder, unspecified: Secondary | ICD-10-CM

## 2023-08-04 DIAGNOSIS — F411 Generalized anxiety disorder: Secondary | ICD-10-CM

## 2023-08-04 DIAGNOSIS — F332 Major depressive disorder, recurrent severe without psychotic features: Secondary | ICD-10-CM
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
## 2023-08-10 ENCOUNTER — Encounter (HOSPITAL_COMMUNITY): Payer: Self-pay | Admitting: Licensed Clinical Social Worker

## 2023-08-10 ENCOUNTER — Ambulatory Visit (INDEPENDENT_AMBULATORY_CARE_PROVIDER_SITE_OTHER): Payer: 59 | Admitting: Licensed Clinical Social Worker

## 2023-08-10 ENCOUNTER — Ambulatory Visit (HOSPITAL_COMMUNITY): Payer: Self-pay | Admitting: Licensed Clinical Social Worker

## 2023-08-10 DIAGNOSIS — F4310 Post-traumatic stress disorder, unspecified: Secondary | ICD-10-CM

## 2023-08-10 DIAGNOSIS — F32A Depression, unspecified: Secondary | ICD-10-CM

## 2023-08-10 DIAGNOSIS — F419 Anxiety disorder, unspecified: Secondary | ICD-10-CM

## 2023-08-10 DIAGNOSIS — F332 Major depressive disorder, recurrent severe without psychotic features: Secondary | ICD-10-CM

## 2023-08-10 NOTE — Progress Notes (Signed)
 Patient did not show for appointment.

## 2023-08-26 ENCOUNTER — Ambulatory Visit (HOSPITAL_COMMUNITY): Payer: 59 | Admitting: Licensed Clinical Social Worker

## 2023-09-01 DIAGNOSIS — Z0289 Encounter for other administrative examinations: Secondary | ICD-10-CM

## 2023-09-03 ENCOUNTER — Ambulatory Visit (HOSPITAL_COMMUNITY): Payer: Self-pay | Admitting: Psychiatry

## 2023-09-08 ENCOUNTER — Ambulatory Visit (HOSPITAL_COMMUNITY): Payer: Self-pay | Admitting: Licensed Clinical Social Worker

## 2023-11-30 ENCOUNTER — Encounter (HOSPITAL_COMMUNITY): Payer: Self-pay | Admitting: Psychiatry

## 2023-11-30 ENCOUNTER — Telehealth (INDEPENDENT_AMBULATORY_CARE_PROVIDER_SITE_OTHER): Payer: Self-pay | Admitting: Psychiatry

## 2023-11-30 DIAGNOSIS — F419 Anxiety disorder, unspecified: Secondary | ICD-10-CM

## 2023-11-30 DIAGNOSIS — F4310 Post-traumatic stress disorder, unspecified: Secondary | ICD-10-CM

## 2023-11-30 DIAGNOSIS — F410 Panic disorder [episodic paroxysmal anxiety] without agoraphobia: Secondary | ICD-10-CM

## 2023-11-30 DIAGNOSIS — F411 Generalized anxiety disorder: Secondary | ICD-10-CM

## 2023-11-30 DIAGNOSIS — F332 Major depressive disorder, recurrent severe without psychotic features: Secondary | ICD-10-CM

## 2023-11-30 MED ORDER — BUPROPION HCL ER (SR) 100 MG PO TB12: 100.0000 mg | ORAL_TABLET | Freq: Every day | ORAL | 0 refills | Status: AC

## 2023-11-30 MED ORDER — HYDROXYZINE HCL 10 MG PO TABS: 10.0000 mg | ORAL_TABLET | Freq: Every day | ORAL | 0 refills | Status: DC | PRN

## 2023-11-30 MED ORDER — ESCITALOPRAM OXALATE 10 MG PO TABS: 10.0000 mg | ORAL_TABLET | Freq: Every day | ORAL | 0 refills | Status: DC
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
## 2024-01-04 ENCOUNTER — Telehealth (INDEPENDENT_AMBULATORY_CARE_PROVIDER_SITE_OTHER): Payer: Self-pay | Admitting: Psychiatry

## 2024-01-04 ENCOUNTER — Encounter (HOSPITAL_COMMUNITY): Payer: Self-pay | Admitting: Psychiatry

## 2024-01-04 DIAGNOSIS — F332 Major depressive disorder, recurrent severe without psychotic features: Secondary | ICD-10-CM

## 2024-01-04 DIAGNOSIS — F4310 Post-traumatic stress disorder, unspecified: Secondary | ICD-10-CM

## 2024-01-04 DIAGNOSIS — F411 Generalized anxiety disorder: Secondary | ICD-10-CM

## 2024-01-04 DIAGNOSIS — F32A Depression, unspecified: Secondary | ICD-10-CM

## 2024-01-04 MED ORDER — HYDROXYZINE HCL 10 MG PO TABS: 10.0000 mg | ORAL_TABLET | Freq: Every day | ORAL | 0 refills | Status: AC | PRN

## 2024-01-04 MED ORDER — ESCITALOPRAM OXALATE 10 MG PO TABS: 10.0000 mg | ORAL_TABLET | Freq: Every day | ORAL | 0 refills | Status: DC
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
## 2024-02-12 ENCOUNTER — Telehealth (HOSPITAL_COMMUNITY): Payer: Self-pay | Admitting: Psychiatry

## 2024-02-12 DIAGNOSIS — F32A Depression, unspecified: Secondary | ICD-10-CM

## 2024-02-12 MED ORDER — ESCITALOPRAM OXALATE 10 MG PO TABS: 10.0000 mg | ORAL_TABLET | Freq: Every day | ORAL | 0 refills | Status: AC

## 2024-02-12 NOTE — Telephone Encounter (Signed)
 Received fax from patient's pharmacy requesting 90 Day Prescription (90 tablets) for escitalopram  (LEXAPRO ) 10 MG tablet  .   [id number] - [place], [place] - [address] (Ph: [phone])   Last ordered: 01/04/2024 - 30 tablets Last visit: 01/04/2024 Next visit: 02/22/2024

## 2024-02-22 ENCOUNTER — Telehealth (HOSPITAL_COMMUNITY): Payer: Self-pay | Admitting: Psychiatry

## 2024-02-22 ENCOUNTER — Encounter (HOSPITAL_COMMUNITY): Payer: Self-pay

## 2024-02-23 ENCOUNTER — Encounter (HOSPITAL_COMMUNITY): Payer: Self-pay | Admitting: Psychiatry
